# Patient Record
Sex: Female | Born: 1970
Health system: Southern US, Community
[De-identification: ages and names within clinical notes are randomized; demographics above are authoritative.]

## PROBLEM LIST (undated history)

## (undated) ENCOUNTER — Ambulatory Visit: Admission: EM | Payer: 59 | Source: Home / Self Care

## (undated) DIAGNOSIS — E669 Obesity, unspecified: Secondary | ICD-10-CM

## (undated) DIAGNOSIS — E785 Hyperlipidemia, unspecified: Secondary | ICD-10-CM

## (undated) DIAGNOSIS — F329 Major depressive disorder, single episode, unspecified: Secondary | ICD-10-CM

## (undated) DIAGNOSIS — R112 Nausea with vomiting, unspecified: Secondary | ICD-10-CM

## (undated) DIAGNOSIS — F419 Anxiety disorder, unspecified: Secondary | ICD-10-CM

## (undated) DIAGNOSIS — K219 Gastro-esophageal reflux disease without esophagitis: Secondary | ICD-10-CM

## (undated) DIAGNOSIS — F32A Depression, unspecified: Secondary | ICD-10-CM

## (undated) DIAGNOSIS — T8859XA Other complications of anesthesia, initial encounter: Secondary | ICD-10-CM

## (undated) DIAGNOSIS — Z9889 Other specified postprocedural states: Secondary | ICD-10-CM

## (undated) HISTORY — PX: OTHER SURGICAL HISTORY: SHX169

## (undated) HISTORY — PX: ABDOMINAL HYSTERECTOMY: SHX81

## (undated) HISTORY — DX: Gastro-esophageal reflux disease without esophagitis: K21.9

## (undated) HISTORY — PX: LAPAROSCOPIC OOPHERECTOMY: SHX6507

## (undated) HISTORY — PX: CHOLECYSTECTOMY: SHX55

## (undated) HISTORY — PX: APPENDECTOMY: SHX54

## (undated) HISTORY — DX: Obesity, unspecified: E66.9

## (undated) HISTORY — DX: Anxiety disorder, unspecified: F41.9

## (undated) HISTORY — DX: Hyperlipidemia, unspecified: E78.5

## (undated) HISTORY — PX: UPPER GI ENDOSCOPY: SHX6162

## (undated) HISTORY — DX: Depression, unspecified: F32.A

---
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
## 2004-02-19 ENCOUNTER — Ambulatory Visit: Payer: Self-pay

## 2004-05-23 ENCOUNTER — Ambulatory Visit (HOSPITAL_COMMUNITY): Admission: RE | Admit: 2004-05-23 | Discharge: 2004-05-23 | Payer: Self-pay | Admitting: Gynecology

## 2004-05-23 ENCOUNTER — Encounter (INDEPENDENT_AMBULATORY_CARE_PROVIDER_SITE_OTHER): Payer: Self-pay | Admitting: Specialist

## 2004-07-09 ENCOUNTER — Ambulatory Visit: Payer: Self-pay

## 2006-04-12 ENCOUNTER — Ambulatory Visit: Payer: Self-pay | Admitting: Gynecology

## 2006-04-12 ENCOUNTER — Encounter (INDEPENDENT_AMBULATORY_CARE_PROVIDER_SITE_OTHER): Payer: Self-pay | Admitting: Gynecology

## 2006-11-22 ENCOUNTER — Ambulatory Visit: Payer: Self-pay | Admitting: Gynecology

## 2007-01-10 ENCOUNTER — Ambulatory Visit: Payer: Self-pay | Admitting: Gynecology

## 2007-01-10 ENCOUNTER — Encounter (INDEPENDENT_AMBULATORY_CARE_PROVIDER_SITE_OTHER): Payer: Self-pay | Admitting: Gynecology

## 2007-01-10 ENCOUNTER — Ambulatory Visit (HOSPITAL_COMMUNITY): Admission: RE | Admit: 2007-01-10 | Discharge: 2007-01-11 | Payer: Self-pay | Admitting: Gynecology

## 2007-01-25 ENCOUNTER — Ambulatory Visit: Payer: Self-pay | Admitting: Family Medicine

## 2007-02-11 ENCOUNTER — Ambulatory Visit: Payer: Self-pay | Admitting: Gynecology

## 2007-02-14 ENCOUNTER — Ambulatory Visit: Payer: Self-pay | Admitting: Gynecology

## 2007-02-21 ENCOUNTER — Ambulatory Visit: Payer: Self-pay | Admitting: Family Medicine

## 2008-01-18 ENCOUNTER — Ambulatory Visit: Payer: Self-pay | Admitting: Gynecology

## 2009-03-29 ENCOUNTER — Ambulatory Visit: Payer: Self-pay | Admitting: General Practice

## 2009-05-02 ENCOUNTER — Ambulatory Visit: Payer: Self-pay | Admitting: Obstetrics and Gynecology

## 2009-05-03 ENCOUNTER — Encounter: Payer: Self-pay | Admitting: Family Medicine

## 2009-05-03 LAB — CONVERTED CEMR LAB
Clue Cells Wet Prep HPF POC: NONE SEEN
WBC, Wet Prep HPF POC: NONE SEEN

## 2010-09-09 NOTE — H&P (Signed)
[name], [name]              ACCOUNT NO.:  [id number]   MEDICAL RECORD NO.:  [id number]         PATIENT TYPE:  WOIB   LOCATION:                                FACILITY:  [facility]   PHYSICIAN:  Ginger Carne, MD  DATE OF BIRTH:  1970/06/02   DATE OF ADMISSION:  01/10/2007  DATE OF DISCHARGE:                              HISTORY & PHYSICAL   REASON FOR HOSPITALIZATION:  Menorrhagia and severe dysmenorrhea, with  genuine urinary stress incontinence, second-degree uterovaginal prolapse  and third degree cystocele.   HISTORY OF PRESENT ILLNESS:  This patient is a 40 year old gravida 4,  para 3-0-1-3 Caucasian female with a longstanding worsening history over  the past 2-3 years of heavy menses lasting 7-8 days in length and severe  dysmenorrhea.  The patient has utilized nonsteroidal anti-inflammatory  agents, including oral contraception with minimal benefit.  Endometrial  biopsy revealed proliferative endometrium without neoplasia or  hyperplasia.   Transvaginal sonography demonstrated no evidence of the intracavitary  lesions with SIS confirmation.  In addition, the patient states that she  loses urine with coughing, straining and other Valsalva maneuvers.  She  denies postvoid dribbling, nocturia or overactive bladder symptoms.  She  has never had urological surgery and takes no medications to enhance her  propensity for losing urine.  She does not have fecal incontinence.   OB/GYN HISTORY:  The patient has had three full-term vaginal deliveries  and one miscarriage, first trimester, in the past.  She uses condoms for  contraception   ALLERGIES:  None.   CURRENT MEDICATIONS:  Vicodin one to two every 6-8 hours as needed for  dysmenorrhea, Lexapro 10 mg twice a day.   PAST SURGICAL HISTORY:  The patient has had a normal colonoscopy in  2005.   PAST MEDICAL HISTORY:  Depression.   SOCIAL HISTORY:  The patient stopped smoking 2 months ago.  Denies  alcohol or illicit drug  abuse.   REVIEW OF SYSTEMS:  14-point comprehensive review of systems within  normal limits.   FAMILY HISTORY:  Her mother has type 2 diabetes.  Otherwise no first-  degree relatives with breast, ovarian, or uterine carcinoma.   PHYSICAL EXAMINATION:  GENERAL APPEARANCE:  Pleasant female, no acute  distress.  VITAL SIGNS:  Blood pressure is 111/79, pulse 81 and regular, weight 183  pounds, height 5 feet, 8 inches.  HEENT: Grossly normal.  BREASTS:  Breast exam without masses, discharge, thickenings or  tenderness.  CHEST:  Clear to percussion and auscultation.  CARDIOVASCULAR:  Without murmurs or enlargements.  Regular rate and  rhythm.  EXTREMITIES, LYMPHATICS, SKIN AND NEUROLOGICAL:  All within normal  limits.  ABDOMEN:  Soft without gross hepatosplenomegaly.  PELVIC:  Normal Pap smear in 03/2006.  EXTERNAL GENITALIA: Vulva and vagina normal.  Cervix smooth without  erosions or lesions.  The uterus is 6 weeks in size, globular.  Both  adnexa found to be palpable and normal.  The patient has a third degree  cystocele with evidence of second degree vault prolapse.  No evidence of  a rectocele.  RECTOVAGINAL:  Confirmatory.  Residual  urine volume is 18 mL with a  positive cough stress test and Q-tip angle of greater than 60 degrees.   IMPRESSION/PLAN:  Menorrhagia with intractable dysmenorrhea and genuine  urinary stress incontinence.   PLAN:  The patient after discussing options including use of a Mirena  IUD, endometrial femoral balloon ablation and a total vaginal  hysterectomy, opted for the latter.  Ashby Dawes of said procedure discussed  in detail, including possible injuries to ureter, bowel, bladder,  possible conversion to a laparoscopic or open procedure, hemorrhage  requiring blood transfusion, infection and other unforeseen  complications.  Nature of tension-free vaginal tape procedure with  cystoscopy and uterosacral vaginal vault suspension discussed.  Risks   including possible injuries to bladder or ureter, graft erosion,  rejection or infection, and possible recurrent incontinence and or  urinary urgency and or urinary retention.  All questions answered to the  satisfaction of said patient.  The patient verbalized understanding of  same.      Ginger Carne, MD  Electronically Signed     SHB/MEDQ  D:  01/08/2007  T:  01/09/2007  Job:  [id number]

## 2010-09-09 NOTE — Assessment & Plan Note (Signed)
NAME:  [name], [name]              ACCOUNT NO.:  [id number]   MEDICAL RECORD NO.:  [id number]          PATIENT TYPE:  POB   LOCATION:  [facility] at [facility]         FACILITY:  [facility]   PHYSICIAN:  Johnella Moloney, MD        DATE OF BIRTH:  Mar 12, 1971   DATE OF SERVICE:  01/25/2007                                  CLINIC NOTE   CHIEF COMPLAINT:  Vaginal irritation, status post surgery.   HISTORY OF PRESENT ILLNESS:  The patient is a 40 year old who underwent  a total vaginal hysterectomy and TVT procedure, anterior colporrhaphy  and McCall colposuspension performed by Ginger Carne, MD, on  January 10, 2007.  The patient had an uncomplicated postoperative  course and was discharged to home on the following morning.  She comes  to the clinic today reporting some vaginal irritation that is noted when  she is walking.  It feels like a misplaced tampon.  Otherwise the  patient reports some brownish discharge which has been routine since the  operation and denies any other further symptoms.  She is also requesting  a refill of her Vicodin prescription for pain.  She does report having  increased pain since she started working.  She denies any fevers,  chills, sweats or any other symptoms.   PHYSICAL EXAMINATION:  Blood pressure is 107/74, height is 5 feet 8  inches.  GENERAL:  No apparent distress.  PELVIC:  The patient is noted to have normal external female genitalia.  On speculum exam, she has a well-healing vaginal cuff, some brownish  discharge noted in the vaginal vault which is consistent with routine  postoperative drainage.  On the anterior colporrhaphy site, the patient  was noted to have a suture that was present and about 1.5 cm of suture  length was noted past a knot after the anterior colporrhaphy repair.  This extra suture material was trimmed down close to the level of the  knot, leaving about 2 mm of suture material past the knot.  Otherwise,  no mesh material was  identified and anterior colporrhaphy incision was  clean, dry, and intact.   IMPRESSION:  Vaginal irritation from suture material.   PLAN:  The patient is now status post trimming of excess suture material  that was noted on examination.  A normal postoperative examination  otherwise.  She was told to come back if she has any further worsening  symptoms or any other concerns.  She was given a prescription for  Vicodin 500/5 mg one to two tablets every 6 hours as needed for pain  with 30 tablets and no refills.  The patient was told to follow up in 3  weeks for further evaluation.           ______________________________  Johnella Moloney, MD     UD/MEDQ  D:  01/25/2007  T:  01/26/2007  Job:  [id number]

## 2010-09-09 NOTE — Assessment & Plan Note (Signed)
NAME:  [name], MEHTA              ACCOUNT NO.:  [id number]   MEDICAL RECORD NO.:  [id number]          PATIENT TYPE:  POB   LOCATION:  [facility] at [facility]         FACILITY:  [facility]   PHYSICIAN:  Tinnie Gens, MD        DATE OF BIRTH:  29-Jul-1970   DATE OF SERVICE:  02/21/2007                                  CLINIC NOTE   CHIEF COMPLAINT:  Boil on leg.   HISTORY OF PRESENT ILLNESS:  The patient is a 40 year old who underwent  hysterectomy some time ago was seen, was seen last week in my office.  Since that time has developed boil on the right inner thigh.  She had  shaved this area.   Discharge on hot soaks.  She does not believe it has come to a head.  She denies fevers, chills.  No erythema in the area.   ON EXAM:  Vitals as on the chart.  GENERAL:  Well-developed, well-nourished female in no acute distress.  Upper thigh shows a 1 x 0.5 cm indurated area with no significant  erythema and no fluctuance.   IMPRESSION:  Boil right side.   PLAN:  Treat for presumed MRSA with doxicycline 100 mg p.o. b.i.d. x10  days.           ______________________________  Tinnie Gens, MD     TP/MEDQ  D:  02/21/2007  T:  02/22/2007  Job:  [id number]

## 2010-09-09 NOTE — Op Note (Signed)
NAMEYAEKO, FAZEKAS              ACCOUNT NO.:  [id number]   MEDICAL RECORD NO.:  [id number]          PATIENT TYPE:  OIB   LOCATION:  9312                          FACILITY:  [facility]   PHYSICIAN:  Ginger Carne, MD  DATE OF BIRTH:  03-06-1971   DATE OF PROCEDURE:  01/10/2007  DATE OF DISCHARGE:                               OPERATIVE REPORT   PREOPERATIVE DIAGNOSES:  1. Partial uterovaginal prolapse.  2. Genuine urinary stress incontinence.  3. Menorrhagia.   POSTOPERATIVE DIAGNOSES:  1. Partial uterovaginal prolapse.  2. Genuine urinary stress incontinence.  3. Menorrhagia.  4. Cystocele.   POSTOPERATIVE DIAGNOSES:  1. Partial uterovaginal prolapse.  2. Genuine urinary stress incontinence.  3. Menorrhagia.  4. Cystocele.   PROCEDURES:  1. Tension-free vaginal tape procedure with cystoscopy.  2. Total vaginal hysterectomy with preservation of both tubes and      ovaries with Restaurant manager, [employer].  3. Anterior colporrhaphy.   SURGEON:  Ginger Carne, MD.   ASSISTANTMichele Mcalpine D. Rose, M.D.   ESTIMATED BLOOD LOSS:  75 mL.   COMPLICATIONS:  None immediate.   DISPOSITION OF SPECIMEN:  To pathology.   SPECIMEN:  Uterus, cervix.   OPERATIVE FINDINGS:  External genitalia, vulva and vagina were normal,  second-degree uterovaginal prolapse noted.  The uterus and cervix  appeared normal.  Both tubes and ovaries appeared normal.   OPERATIVE PROCEDURE:  The patient prepped and draped in the usual  fashion and placed in the lithotomy position.  Betadine solution used  for antiseptic and the patient was catheterized prior to procedure.  After adequate general anesthesia, a double tooth tenaculum placed on  the anterior and posterior lips of the cervix.  Marcaine with  epinephrine was injected circumferentially around the cervix.  Afterwards, 2 cm of anterior and posterior vaginal epithelium were  incised transversely.  Peritoneal reflections identified and opened  without injury  to their respective organs.  Uterosacral cardinal  ligament complexes clamped and ligated with 0 Vicryl suture.  This  extended to the uterine vasculature in the standard [place] fashion.  The vessels were clamped cut and ligated with 0 Vicryl suture.  This was  advanced to the broad ligaments at the utero-ovarian ligament complex,  transfixation with 0 Vicryl sutures was used on either side and the  ligaments severed from the uterine corpus.  Bleeding points  hemostatically checked.  Blood clots removed.  A McCall colposuspension  was performed with 0 Vicryl suture and the remainder of the cuff closed  with 0 Vicryl running interlocking suture.   Tension-free vaginal tape procedure was performed.  The anterior vaginal  epithelium was incised in the midline.  The pubovesical cervical fascia  was dissected off the vaginal epithelium.  Space of Retzius identified.  The pubovesical cervical fascia was fractured in the midline and  reapproximated with 2-0 Prolene interrupted sutures.  Using a bottom up  [employer] Prolene TVT system, trocars were placed from below  through the space of Retzius hugging the posterior aspect of the pubic  bone and emanating 1-2 cm lateral to the symphysis pubis.  Immediate  cystoscopy followed.  No injury was noted to the bladder including the  urethra, trigone area, lateral walls and dome and posterior surface of  the bladder.  At this point, the fluid was removed.  Tensioning of the  tape to 250 mL of saline in the bladder was accomplished.  The plastic  sheaths were removed from the tape and bladder was emptied.  The skin  sites were closed with Dermabond and 2-0 Monocryl running interlocking  suture used for closure of the vaginal epithelium.  The patient  tolerated the procedure well and returned to the post anesthesia  recovery room in excellent condition.      Ginger Carne, MD  Electronically Signed     SHB/MEDQ  D:  01/10/2007  T:   01/10/2007  Job:  [id number]

## 2010-09-09 NOTE — Assessment & Plan Note (Signed)
NAME:  Laura, Grant              ACCOUNT NO.:  [id number]   MEDICAL RECORD NO.:  [id number]          PATIENT TYPE:  POB   LOCATION:  [facility] at [facility]         FACILITY:  [facility]   PHYSICIAN:  Ginger Carne, MD DATE OF BIRTH:  1971-01-28   DATE OF SERVICE:  01/18/2008                                  CLINIC NOTE   Ms. [name] is here today for routine gynecologic evaluation.  She had  undergone, in September 2008, a total vaginal hysterectomy, tension-free  vaginal tape procedure with cystoscopy, anterior colporrhaphy and McCall  colposuspension.  The patient states she still has symptoms of type 1  urinary stress incontinence.  However, she has no other specific  gynecological complaints.  In addition, she has no GI, GU or cardiac  symptomatology.  She uses Xanax for anxiety control and the patient is  not abusing said medication.  She uses 1 tablet 0.5 mg up to 3 times a  day and I feel comfortable prescribing this for her, it helps her  control and deal with her anxiety in terms of coping through the day.  The remainder of her history is noted in the chart.   SALIENT PHYSICAL FINDINGS:  VITAL SIGNS:  Blood pressure one weight over  71, weight 209 pounds, height 5 feet 8 inches, pulse 81 and regular.  HEENT:  Grossly normal.  BREASTS:  Without masses, discharge, thickenings, or tenderness.  CHEST:  Clear to percussion and auscultation.  CARDIOVASCULAR:  Without murmurs or enlargements.  Regular rate and  rhythm.  EXTREMITIES, LYMPHATICS, SKIN, NEUROLOGICAL, MUSCULOSKELETAL SYSTEMS:  Within normal limits.  ABDOMEN:  Soft without gross hepatosplenomegaly.  PELVIC:  External genitalia, vulva, and vagina reveal evidence of a well-  supported vaginal cuff that is healed.  Anteriorly, there is a  approximately 1 cm tender nodule located at the urethral vesical  junction.  Both adnexa palpable found to be normal.   IMPRESSION AND PLAN:  Overall her examination appears to be  normal.  I  think there is a possibility that one side of her sling may have slept  through the musculature and has created a small bulge that is end after  her intercourse.  The patient uses lubrication.  She is emptied her  bladder more frequently to avoid of loss of urine and volume control  that did seem to work well.  At this point, she is not ready to have a  redo and I explained to the patient that trying to remove said nodule  may result in urethrovaginal fistula as we will sit tight for now and  she is happy with this approach.  I have prescribed Xanax 0.5 mg 1 up to  3 times a day #90 for anxiety 3 refills.  There is no new family history  or old history of breast cancer.           ______________________________  Ginger Carne, MD     SHB/MEDQ  D:  01/18/2008  T:  01/18/2008  Job:  [id number]

## 2010-09-09 NOTE — Discharge Summary (Signed)
[name], [name]              ACCOUNT NO.:  [id number]   MEDICAL RECORD NO.:  [id number]          PATIENT TYPE:  OIB   LOCATION:  9312                          FACILITY:  [facility]   PHYSICIAN:  Ginger Carne, MD  DATE OF BIRTH:  1971-03-17   DATE OF ADMISSION:  01/10/2007  DATE OF DISCHARGE:                               DISCHARGE SUMMARY   REASON FOR HOSPITALIZATION:  Menorrhagia, severe dysmenorrhea, genuine  urinary stress incontinence, third-degree cystocele with second-degree  uterovaginal prolapse.   IN-HOSPITAL PROCEDURES:  Total vaginal hysterectomy, TVT procedure with  cystoscopy, anterior colporrhaphy, McCall colposuspension.   FINAL DIAGNOSES:  Total vaginal hysterectomy, TVT procedure with  cystoscopy, anterior colporrhaphy, McCall colposuspension.   HOSPITAL COURSE:  This is a 40 year old multiparous Caucasian female who  underwent the aforementioned procedure on the 15th of September 2008.  Intraoperative course was uneventful.  Postoperatively she was afebrile,  her vital signs were stable.  Postoperative hemoglobin 10.6 from a pre-  op of 13.5.  Creatinine 0.56.  She had scant vaginal flow.  Abdomen was  soft.  Incision dry.  Lungs clear, and calves without tenderness.  She  voided 400 mL following removal of her Foley catheter in the morning.   She was discharged with routine postoperative instructions including  timed voids whereby every four hours the patient will void for the next  three weeks, contacting the office for temperature elevation above 100.4  degrees Fahrenheit, increasing abdominal or incisional pain, increased  vaginal bleeding, GI or GU complains.  She will also contact the office  for urinary retention or increase in frequency of voiding.  She was  discharged with Percocet 5/325 1-2 every 4-6 hours and Keflex 500 mg  twice a day for five days.  She will also go home on Lexapro, Xanax as  well.  The patient will contact the office to be seen  in four weeks at  the [facility] facility.  All questions answered to the satisfaction of  said patient, and patient verbalized understanding of same.      Ginger Carne, MD  Electronically Signed     SHB/MEDQ  D:  01/11/2007  T:  01/11/2007  Job:  [id number]

## 2010-09-12 NOTE — Op Note (Signed)
[name], [name]              ACCOUNT NO.:  [id number]   MEDICAL RECORD NO.:  [id number]          PATIENT TYPE:  AMB   LOCATION:  [facility]                           FACILITY:  [facility]   PHYSICIAN:  Ginger Carne, MD  DATE OF BIRTH:  October 30, 1970   DATE OF PROCEDURE:  05/23/2004  DATE OF DISCHARGE:                                 OPERATIVE REPORT   PREOPERATIVE DIAGNOSES:  1.  Right ovarian multicystic mass.  2.  Chronic right lower quadrant pain.  3.  Chronic appendicitis.   POSTOPERATIVE DIAGNOSES:  1.  Right ovarian multicystic mass.  2.  Chronic right lower quadrant pain.  3.  Chronic appendicitis.   PROCEDURES:  1.  Laparoscopic right oophorectomy.  2.  Laparoscopic appendectomy.   SURGEON:  Ginger Carne, MD   ASSISTANT:  None.   COMPLICATIONS:  None immediate.   ESTIMATED BLOOD LOSS:  Minimal.   SPECIMENS:  Right ovary and appendix.   ANESTHESIA:  General.   OPERATIVE FINDINGS:  External genitalia, vulva and vagina normal.  Cervix  smooth without erosions or lesions.  The patient had previously excised  salpinx bilaterally preceding in vitro fertilization in the past.  This was  performed at [facility].  The right ovary was  multicystic, about 4 cm in size, with adhesive disease surrounding same.  The patient's diagnosis prior to removal of both tubes was chronic pelvic  inflammatory disease.  The upper abdomen revealed no evidence for Fitz-Hugh-  Curtis syndrome.  Gallbladder sonogram performed prior to surgery revealed  no evidence of gallstones.  The appendix was significantly adherent to its  respective sidewall, thickened, as well as the mesoappendix, and slightly  erythematous, consistent with chronic appendicitis.  Large and small bowel  were grossly normal.  The ureter was carefully identified on the right  pelvic region.  No evidence of femoral, inguinal or obturator hernias.   OPERATIVE PROCEDURE:  Patient prepped and draped in  the usual fashion and  placed in lithotomy position.  Betadine solution used for antiseptic.  Patient catheterized prior to the procedure.  After adequate general  anesthesia, a tenaculum placed on the anterior lip of the cervix and a  ___________ uterine manipulator placed in the endocervical canal.   Vertical infraumbilical incision was made and the Veress needle placed in  the abdomen, and opening and closing pressures were 10-15 mmHg.  The  _________ trocar placed in the same incision, the laparoscope placed in the  trocar sleeve.  Two 5 mm ports were made in the left lower quadrant and left  hypogastric regions, respectively.  Inspection of the pelvic contents was  then followed by identification of the right ureter in its pelvic course.  The peritoneum above said ureter was opened to assure that the course of the  ureter did not interfere with the right infundibulopelvic ligament.  The  right infundibulopelvic ligament was then bipolar cauterized and cut after  skeletonization.  The specimen was removed in the Endopouch bag.   Afterwards, the appendix was visualized and the mesoappendix bipolar  cauterized to the base.  Two  0 Vicryl loops placed at the base and one about  7 mm above the first two.  The appendix cut above the first two ties and  removed through an Endopouch bag.  No active bleeding noted on either site.  Copious irrigation with lactated Ringer's at the appendiceal base and cul-de-  sac was followed by removal of irrigant.  After removal of gas, closure of  10 mm fascial site with 0 Vicryl suture and 4-0 Vicryl for subcuticular  closure.  Instrument and sponge count were correct.  The patient tolerated  the procedure well and returned to the postanesthesia recovery room in  excellent condition.      SHB/MEDQ  D:  05/23/2004  T:  05/23/2004  Job:  [id number]

## 2010-09-12 NOTE — H&P (Signed)
NAMETASMINE, HIPWELL              ACCOUNT NO.:  [id number]   MEDICAL RECORD NO.:  [id number]          PATIENT TYPE:  AMB   LOCATION:  SDC                           FACILITY:  [facility]   PHYSICIAN:  Ginger Carne, MD  DATE OF BIRTH:  03-21-1971   DATE OF ADMISSION:  DATE OF DISCHARGE:                                HISTORY & PHYSICAL   ADMITTING DIAGNOSIS:  Persistent right ovarian mass and right lower quadrant  pain.   IN-HOSPITAL PROCEDURE:  Laparoscopic right oophorectomy and appendectomy.   HISTORY OF PRESENT ILLNESS:  This patient is a 40 year old gravida 3 para 3-  0-0-3 Caucasian female admitted because of a 84-month history of an enlarging  right adnexal mass.  The patient was seen in July 2005 with complaints of  right-sided discomfort.  At that time an ultrasound demonstrated about a 4  cm cystic mass of the right ovary.  It was multicystic with smooth internal  echoes and no suggestion for carcinoma.  The patient was advised to return  for follow-up.  Her pain has continued and worsened with this said mass  about 4.5 to 5 cm.  There are no genitourinary, gastrointestinal, or  musculoskeletal sources for her pain.  Urinalysis is normal.   OBSTETRICAL AND GYNECOLOGICAL HISTORY:  The patient has had three full-term  vaginal deliveries in November 2003 and in 1991 and 1992.  In 2000 the  patient was diagnosed via laparoscopy with a bilateral tubal occlusion and  bilateral fimbrioplasty was performed.  She was unable to conceive and  through [facility] underwent an in vitro procedure which  was successful in 2002.  Prior to that event, the patient had both right and  left salpinx removed at [facility] to facility in vitro success.   ALLERGIES:  None.   CURRENT MEDICATIONS:  None.   SOCIAL HISTORY:  The patient smokes less than half a pack of cigarettes a  day.  Does not abuse alcohol or illicit drug abuse.   REVIEW OF SYSTEMS:  Negative.   SURGICAL  HISTORY:  Per above.   PHYSICAL EXAMINATION:  VITAL SIGNS:  The patient's height is 5 feet 8  inches, weight 205 pounds, blood pressure 120/78.  HEENT:  Grossly normal.  BREAST:  Without mass or discharge, thickenings, or tenderness.  CHEST:  Clear to percussion and auscultation.  CARDIOVASCULAR:  Without murmurs or enlargements, regular rate and rhythm.  EXTREMITY, LYMPHATIC, SKIN, NEUROLOGIC, MUSCULOSKELETAL:  Normal.  ABDOMEN:  Soft without gross hepatosplenomegaly.  PELVIC:  External genitalia, vulva, and vagina normal.  Cervix smooth  without erosions or lesions.  Uterus is normal in size.  Right adnexa is  tender with slight fullness.  Left adnexa is normal.  Rectal exam is  hemoccult negative.   IMPRESSION:  Persistent right adnexal mass for 6 months.   PLAN:  After a thorough discussion with the patient understanding that it is  unlikely this mass is a carcinoma, it is still prudent to proceed with said  operative laparoscopy.  The nature of said procedure discussed in detail.  The patient understands that if the  cysts are unable to be satisfactorily  removed, right oophorectomy will be performed and if the appendix is easy to  remove, it will be done at the same time.      SHB/MEDQ  D:  05/19/2004  T:  05/19/2004  Job:  [id number]

## 2011-02-05 LAB — CBC
HCT: 31.1 — ABNORMAL LOW
MCV: 87
RBC: 3.57 — ABNORMAL LOW
WBC: 14.5 — ABNORMAL HIGH

## 2011-02-05 LAB — BASIC METABOLIC PANEL
BUN: 3 — ABNORMAL LOW
Chloride: 103
GFR calc non Af Amer: >60
Glucose, Bld: 150 — ABNORMAL HIGH
Potassium: 4.3

## 2011-02-05 LAB — TYPE AND SCREEN: ABO/RH(D): O POS

## 2011-02-06 LAB — BASIC METABOLIC PANEL
BUN: 8
CO2: 25
Chloride: 104
Creatinine, Ser: 0.56
Glucose, Bld: 99

## 2011-02-06 LAB — CBC
MCHC: 34.4
MCV: 85.9
Platelets: 214
RDW: 13.4

## 2011-02-06 LAB — HCG, SERUM, QUALITATIVE: Preg, Serum: NEGATIVE

## 2011-02-25 ENCOUNTER — Ambulatory Visit: Payer: Self-pay | Admitting: General Practice

## 2011-12-30 ENCOUNTER — Ambulatory Visit: Payer: Self-pay | Admitting: General Practice

## 2015-05-29 ENCOUNTER — Other Ambulatory Visit: Payer: Self-pay | Admitting: Family Medicine

## 2015-05-29 DIAGNOSIS — R1011 Right upper quadrant pain: Secondary | ICD-10-CM

## 2015-06-04 ENCOUNTER — Ambulatory Visit
Admission: RE | Admit: 2015-06-04 | Discharge: 2015-06-04 | Disposition: A | Discharge status: Home / Self Care | Payer: BLUE CROSS/BLUE SHIELD | Source: Ambulatory Visit | Attending: Family Medicine | Admitting: Family Medicine

## 2015-06-04 DIAGNOSIS — K824 Cholesterolosis of gallbladder: Secondary | ICD-10-CM | POA: Insufficient documentation

## 2015-06-04 DIAGNOSIS — R1011 Right upper quadrant pain: Secondary | ICD-10-CM | POA: Diagnosis not present

## 2015-06-18 ENCOUNTER — Other Ambulatory Visit: Payer: Self-pay | Admitting: Gastroenterology

## 2015-06-18 DIAGNOSIS — R194 Change in bowel habit: Secondary | ICD-10-CM

## 2015-06-18 DIAGNOSIS — R1011 Right upper quadrant pain: Secondary | ICD-10-CM

## 2015-06-27 ENCOUNTER — Ambulatory Visit: Admission: RE | Admit: 2015-06-27 | Payer: BLUE CROSS/BLUE SHIELD | Source: Ambulatory Visit

## 2015-08-15 ENCOUNTER — Encounter: Payer: Self-pay | Admitting: Emergency Medicine

## 2015-08-15 ENCOUNTER — Emergency Department
Admission: EM | Admit: 2015-08-15 | Discharge: 2015-08-15 | Disposition: A | Discharge status: Home / Self Care | Payer: BLUE CROSS/BLUE SHIELD | Attending: Emergency Medicine | Admitting: Emergency Medicine

## 2015-08-15 ENCOUNTER — Emergency Department: Payer: BLUE CROSS/BLUE SHIELD

## 2015-08-15 DIAGNOSIS — R1011 Right upper quadrant pain: Secondary | ICD-10-CM | POA: Diagnosis present

## 2015-08-15 DIAGNOSIS — Z90710 Acquired absence of both cervix and uterus: Secondary | ICD-10-CM | POA: Diagnosis not present

## 2015-08-15 DIAGNOSIS — F17210 Nicotine dependence, cigarettes, uncomplicated: Secondary | ICD-10-CM | POA: Diagnosis not present

## 2015-08-15 DIAGNOSIS — R1010 Upper abdominal pain, unspecified: Secondary | ICD-10-CM

## 2015-08-15 LAB — URINALYSIS COMPLETE WITH MICROSCOPIC (ARMC ONLY)
BILIRUBIN URINE: NEGATIVE
Glucose, UA: NEGATIVE mg/dL
HGB URINE DIPSTICK: NEGATIVE
KETONES UR: NEGATIVE mg/dL
LEUKOCYTES UA: NEGATIVE
Nitrite: NEGATIVE
PH: 7 (ref 5.0–8.0)
PROTEIN: NEGATIVE mg/dL
SPECIFIC GRAVITY, URINE: 1.014 (ref 1.005–1.030)

## 2015-08-15 LAB — CBC
HCT: 44.4 % (ref 35.0–47.0)
HEMOGLOBIN: 15 g/dL (ref 12.0–16.0)
MCH: 28.8 pg (ref 26.0–34.0)
MCHC: 33.8 g/dL (ref 32.0–36.0)
MCV: 85.3 fL (ref 80.0–100.0)
Platelets: 254 10*3/uL (ref 150–440)
RBC: 5.21 MIL/uL — AB (ref 3.80–5.20)
RDW: 13.5 % (ref 11.5–14.5)
WBC: 10.1 10*3/uL (ref 3.6–11.0)

## 2015-08-15 LAB — COMPREHENSIVE METABOLIC PANEL
ALK PHOS: 83 U/L (ref 38–126)
ALT: 16 U/L (ref 14–54)
ANION GAP: 8 (ref 5–15)
AST: 27 U/L (ref 15–41)
Albumin: 4.9 g/dL (ref 3.5–5.0)
BUN: 10 mg/dL (ref 6–20)
CALCIUM: 9.8 mg/dL (ref 8.9–10.3)
CHLORIDE: 103 mmol/L (ref 101–111)
CO2: 28 mmol/L (ref 22–32)
Creatinine, Ser: 0.52 mg/dL (ref 0.44–1.00)
Glucose, Bld: 90 mg/dL (ref 65–99)
Potassium: 4.6 mmol/L (ref 3.5–5.1)
SODIUM: 139 mmol/L (ref 135–145)
Total Bilirubin: 0.5 mg/dL (ref 0.3–1.2)
Total Protein: 8.1 g/dL (ref 6.5–8.1)

## 2015-08-15 LAB — LIPASE, BLOOD: LIPASE: 23 U/L (ref 11–51)

## 2015-08-15 MED ORDER — IOPAMIDOL (ISOVUE-300) INJECTION 61%
100.0000 mL | Freq: Once | INTRAVENOUS | Status: AC | PRN
  Administered 2015-08-15: 100 mL via INTRAVENOUS

## 2015-08-15 MED ORDER — DICYCLOMINE HCL 20 MG PO TABS: 20.0000 mg | ORAL_TABLET | Freq: Three times a day (TID) | ORAL | Status: DC | PRN

## 2015-08-15 MED ORDER — DIATRIZOATE MEGLUMINE & SODIUM 66-10 % PO SOLN
15.0000 mL | Freq: Once | ORAL | Status: AC
  Administered 2015-08-15: 15 mL via ORAL
  Filled 2015-08-15: qty 30

## 2015-08-15 MED ORDER — MORPHINE SULFATE (PF) 4 MG/ML IV SOLN
4.0000 mg | Freq: Once | INTRAVENOUS | Status: AC
  Administered 2015-08-15: 4 mg via INTRAVENOUS
  Filled 2015-08-15: qty 1

## 2015-08-15 MED ORDER — ONDANSETRON HCL 4 MG/2ML IJ SOLN
4.0000 mg | Freq: Once | INTRAMUSCULAR | Status: AC
  Administered 2015-08-15: 4 mg via INTRAVENOUS
  Filled 2015-08-15: qty 2

## 2015-08-15 MED ORDER — ONDANSETRON HCL 4 MG PO TABS: 4.0000 mg | ORAL_TABLET | Freq: Every day | ORAL | Status: DC | PRN

## 2015-08-15 MED ORDER — SODIUM CHLORIDE 0.9 % IV BOLUS (SEPSIS)
1000.0000 mL | Freq: Once | INTRAVENOUS | Status: AC
  Administered 2015-08-15: 1000 mL via INTRAVENOUS

## 2015-08-15 NOTE — ED Provider Notes (Signed)
Patient signed out from Dr. Archie Balboa with acute on chronic abdominal pain. Plan is to follow up with CAT scan of the abdomen and pelvis. Physical Exam  BP 120/71 mmHg  Pulse 92  Temp(Src) 98.2 F (36.8 C) (Oral)  Resp 17  Ht 5\' 8"  (1.727 m)  Wt 235 lb (106.595 kg)  BMI 35.74 kg/m2  SpO2 97% ----------------------------------------- 6:07 PM on 08/15/2015 -----------------------------------------   Physical Exam Patient has not any acute distress. ED Course  Procedures IMPRESSION: 1. No acute abdominal/pelvic findings, mass lesions or adenopathy. 2. Mild diffuse fatty infiltration of the liver. 3. Status post hysterectomy and right oophorectomy. Left ovary appears normal.   Electronically Signed By: Marijo Sanes M.D. On: 08/15/2015 16:10       MDM CAT scan of the abdomen and pelvis without any acute findings. There is a mild diffuse fatty physician in the liver. I discussed the findings with the patient as well as her companion. She has a primary care at the [facility]. She says that at this time she is only on a acid reducer. I will add Bentyl as well as Zofran for her nausea and abdominal cramping. I will also be giving her the phone number for GI. She understands the plan for discharge and follow-up and is willing to comply. She is able to tolerate the by mouth contrast for the CAT scan is requesting something to drink in addition.      Orbie Pyo, MD 08/15/15 [phone]

## 2015-08-15 NOTE — ED Notes (Signed)
Pt informed this nurse she is leaving.  This nurse inquire what room pt is from, pt points to room 5.  Primary nurse notified as pt continues to walk out of ED

## 2015-08-15 NOTE — ED Notes (Signed)
Pt with R upper abdominal pain (under R breast that "sometimes" radiates to R axilla/R back).  Pt states that this pain has been a "constant, jabbing pain since January of this year".  She has been under chiropractic treatment since then and has been diagnosed with gallbladder polyps as well.  She started having diarrhea and nausea yesterday, and pain became intense suddenly this morning.

## 2015-08-15 NOTE — ED Provider Notes (Signed)
[facility] Emergency Department Provider Note   ____________________________________________  Time seen: ~1430  I have reviewed the triage vital signs and the nursing notes.   HISTORY  Chief Complaint Abdominal Pain   History limited by: Not Limited   HPI Reality A Sheasley is a 45 y.o. female who presents to the emergency department today because of concerns for abdominal pain. Is located in the right upper quadrant. She states she has been having this pain on and off for the past 4 months. Today however became severe. It started this morning. She describes it as a twisting sensation. The patient does have associated nausea. The patient states that she has had an ultrasound on her gallbladder which shows polyps. Denies any recent fevers.   History reviewed. No pertinent past medical history.  There are no active problems to display for this patient.   Past Surgical History  Procedure Laterality Date  . Abdominal hysterectomy      No current outpatient prescriptions on file.  Allergies Review of patient's allergies indicates no known allergies.  No family history on file.  Social History Social History  Substance Use Topics  . Smoking status: Current Every Day Smoker -- 1.00 packs/day    Types: Cigarettes  . Smokeless tobacco: None  . Alcohol Use: Yes    Review of Systems  Constitutional: Negative for fever. Cardiovascular: Negative for chest pain. Respiratory: Negative for shortness of breath. Gastrointestinal: Positive for epigastric pain Neurological: Negative for headaches, focal weakness or numbness.  10-point ROS otherwise negative.  ____________________________________________   PHYSICAL EXAM:  VITAL SIGNS: ED Triage Vitals  Enc Vitals Group     BP 08/15/15 1307 119/79 mmHg     Pulse Rate 08/15/15 1307 105     Resp 08/15/15 1307 18     Temp 08/15/15 1307 98.2 F (36.8 C)     Temp Source 08/15/15 1307 Oral     SpO2  08/15/15 1307 98 %     Weight 08/15/15 1307 235 lb (106.595 kg)     Height 08/15/15 1307 5\' 8"  (1.727 m)     Head Cir --      Peak Flow --      Pain Score 08/15/15 1308 7   Constitutional: Alert and oriented. Well appearing and in no distress. Eyes: Conjunctivae are normal. PERRL. Normal extraocular movements. ENT   Head: Normocephalic and atraumatic.   Nose: No congestion/rhinnorhea.   Mouth/Throat: Mucous membranes are moist.   Neck: No stridor. Hematological/Lymphatic/Immunilogical: No cervical lymphadenopathy. Cardiovascular: Normal rate, regular rhythm.  No murmurs, rubs, or gallops. Respiratory: Normal respiratory effort without tachypnea nor retractions. Breath sounds are clear and equal bilaterally. No wheezes/rales/rhonchi. Gastrointestinal: Soft and Minimally tender to palpation in the epigastric and right upper quadrant. Genitourinary: Deferred Musculoskeletal: Normal range of motion in all extremities. No joint effusions.   Neurologic:  Normal speech and language. No gross focal neurologic deficits are appreciated.  Skin:  Skin is warm, dry and intact. No rash noted. Psychiatric: Mood and affect are normal. Speech and behavior are normal. Patient exhibits appropriate insight and judgment.  ____________________________________________    LABS (pertinent positives/negatives)  Labs Reviewed  CBC - Abnormal; Notable for the following:    RBC 5.21 (*)    All other components within normal limits  URINALYSIS COMPLETEWITH MICROSCOPIC ([facility] ONLY) - Abnormal; Notable for the following:    Color, Urine YELLOW (*)    APPearance HAZY (*)    Bacteria, UA MANY (*)    Squamous  Epithelial / LPF 6-30 (*)    All other components within normal limits  LIPASE, BLOOD  COMPREHENSIVE METABOLIC PANEL     ____________________________________________   EKG  None  ____________________________________________    RADIOLOGY  CT abd/pel  pending  ____________________________________________   PROCEDURES  Procedure(s) performed: None  Critical Care performed: No  ____________________________________________   INITIAL IMPRESSION / ASSESSMENT AND PLAN / ED COURSE  Pertinent labs & imaging results that were available during my care of the patient were reviewed by me and considered in my medical decision making (see chart for details).  Patient presented to the emergency department today because of concerns for epigastric pain. Had right upper quadrant ultrasound which showed gallbladder polyps. Given the pain is much worse today will obtain a CT scan to further evaluate potential sources of abdominal pain.  ____________________________________________   FINAL CLINICAL IMPRESSION(S) / ED DIAGNOSES  Abdominal pain  Nance Pear, MD 08/15/15 1446

## 2015-08-15 NOTE — ED Notes (Signed)
Pt's discharge papers and prescriptions put in envelope and left at charge RN desk.

## 2015-08-15 NOTE — ED Notes (Signed)
Pt presents to ED with RUQ abdominal pain. Pt reports has been told she has polyps in her gallbladder. Pt states she has been seen multiple times. Pt states today the pain has increased. Pt states feels like its twisting. Pt reports diarrhea.

## 2015-08-16 MED ORDER — DICYCLOMINE HCL 20 MG PO TABS: 20.0000 mg | ORAL_TABLET | Freq: Three times a day (TID) | ORAL | Status: DC | PRN

## 2015-08-16 MED ORDER — ONDANSETRON HCL 4 MG PO TABS: 4.0000 mg | ORAL_TABLET | Freq: Every day | ORAL | Status: DC | PRN

## 2015-08-16 NOTE — ED Provider Notes (Signed)
Patient called the emergency department and will charge nurse reviewed patient did not receive these prescriptions, and so I printed them out and they will be left at the entrance desk for patient to pick up. Bentyl and Zofran.  Lisa Roca, MD 08/16/15 [phone]

## 2015-08-30 DIAGNOSIS — K635 Polyp of colon: Secondary | ICD-10-CM | POA: Insufficient documentation

## 2015-09-11 DIAGNOSIS — Z9049 Acquired absence of other specified parts of digestive tract: Secondary | ICD-10-CM | POA: Insufficient documentation

## 2016-09-22 ENCOUNTER — Other Ambulatory Visit: Payer: Self-pay | Admitting: Family Medicine

## 2016-09-22 DIAGNOSIS — Z1231 Encounter for screening mammogram for malignant neoplasm of breast: Secondary | ICD-10-CM

## 2016-10-07 ENCOUNTER — Ambulatory Visit
Admission: RE | Admit: 2016-10-07 | Discharge: 2016-10-07 | Disposition: A | Discharge status: Home / Self Care | Payer: BLUE CROSS/BLUE SHIELD | Source: Ambulatory Visit | Attending: Family Medicine | Admitting: Family Medicine

## 2016-10-07 DIAGNOSIS — Z1231 Encounter for screening mammogram for malignant neoplasm of breast: Secondary | ICD-10-CM | POA: Insufficient documentation

## 2016-10-13 ENCOUNTER — Other Ambulatory Visit: Payer: Self-pay | Admitting: *Deleted

## 2016-10-13 ENCOUNTER — Inpatient Hospital Stay
Admission: RE | Admit: 2016-10-13 | Discharge: 2016-10-13 | Disposition: A | Discharge status: Home / Self Care | Payer: Self-pay | Source: Ambulatory Visit | Attending: *Deleted | Admitting: *Deleted

## 2016-10-13 DIAGNOSIS — Z9289 Personal history of other medical treatment: Secondary | ICD-10-CM

## 2017-02-15 DIAGNOSIS — R1310 Dysphagia, unspecified: Secondary | ICD-10-CM | POA: Insufficient documentation

## 2017-02-15 DIAGNOSIS — K219 Gastro-esophageal reflux disease without esophagitis: Secondary | ICD-10-CM | POA: Insufficient documentation

## 2017-07-13 ENCOUNTER — Other Ambulatory Visit: Payer: Self-pay | Admitting: Family Medicine

## 2017-07-19 ENCOUNTER — Other Ambulatory Visit: Payer: Self-pay | Admitting: Family Medicine

## 2017-07-19 DIAGNOSIS — Z1231 Encounter for screening mammogram for malignant neoplasm of breast: Secondary | ICD-10-CM

## 2018-04-19 ENCOUNTER — Encounter: Payer: Self-pay | Admitting: Dietician

## 2018-04-19 ENCOUNTER — Encounter: Payer: BLUE CROSS/BLUE SHIELD | Attending: Family Medicine | Admitting: Dietician

## 2018-04-19 VITALS — Ht 68.0 in | Wt 234.7 lb

## 2018-04-19 DIAGNOSIS — Z6835 Body mass index (BMI) 35.0-35.9, adult: Secondary | ICD-10-CM | POA: Diagnosis not present

## 2018-04-19 DIAGNOSIS — E6609 Other obesity due to excess calories: Secondary | ICD-10-CM | POA: Diagnosis not present

## 2018-04-19 NOTE — Progress Notes (Signed)
Medical Nutrition Therapy:  Visit start time:9:00am   end time: 10:00  Assessment:  Diagnosis: obesity  Past medical history: elevated cholesterol   Psychosocial issues/ stress concerns:  Rates her stress as moderate and indicates "ok" as to how well she is dealing with her stress.  Preferred learning method:  . Hands-on  Current weight: 234.7 lbs Height: 68 in Medications, supplements: see list  Progress and evaluation:  Patient in for initial medical nutrition therapy visit. She reports her weight was relatively stable at 200 lbs for 8 years. She states that since having her gall bladder removed last January, she has gained approximately 35 lbs. When asked, she stated that her problem area related to diet is "eating out". She picks up a fast food breakfast M-F on her way to work. She eats lunch "out", M-F, often making high fat choices. Most of her dinner meals are prepared at home and consist of a meat, starch, and vegetable and salad. She rarely snacks after dinner. She sometimes snacks mid-morning. Meal times ar 8:30am, 2 or 3:00 pm and 6-7:00pm. Her beverages are 2-3 cups of water daily and 6-7 cups of coffee with splenda. Her diet is low in fruits and vegetables even though she likes a variety of each.  She experiences symptoms of low blood sugar if meals are delayed and has a family history of Type 2 diabetes. Her labs 2/'19 indicated glucose within a normal range and total cholesterol and LDL were elevated.  Physical activity: none; has a treadmill and states she plans to use after the holidays.   Nutrition Care Education:  Weight control: Instructed on a meal plan based on 1700 calories including carbohydrate counting and how to better balance carbohydrate, lean protein and non-starchy vegetables. Discussed simple lunch meals she could prepare at home. Also, discussed healthier choices when dining "out". Encouraged to use the meal plan as a guide to practice mindful eating  verses a overly restrictive eating pattern.  Gave and reviewed sample menus.  Nutritional Diagnosis:  [place]-3.3 Overweight/obesity As related to frequent dining out with high fat choices and no structured physical activity.  As evidenced by diet and exercise history..  Intervention: Balance meals with 2-4 oz lean protein (Refer to list), 2-4 servings of carbohydrate and non-starchy vegetables. Increase intake of fruits and non-starchy vegetables. Think of food guide plate- 1/4 plate protein, 1/4 plate starch and 1/2 plate non-starchy vegetables and fruit. Start taking lunch  to work verses eating "out". Read labels for carbohydrate and for saturated fat (goal of no more than 12 gms daily). Exercise goal: walk daily on treadmill at home and walk on 15 minute breaks.  Education Materials given:  . Plate Planner . Food lists/ Planning A Balanced Meal . Combination foods . Sample meal pattern/ menus . Snacking handout . Goals/ instructions  Learner/ who was taught:  . Patient   Level of understanding: . Partial understanding; needs review/ practice  Demonstrated degree of understanding via:   Teach back Learning barriers: . None Willingness to learn/ readiness for change: . Acceptance, ready for change  Monitoring and Evaluation:  Dietary intake, exercise, , and body weight      follow up: 05/17/18 at 3:30pm

## 2018-04-19 NOTE — Patient Instructions (Addendum)
Balance meals with 2-4 oz lean protein (Refer to list), 2-4 servings of carbohydrate and non-starchy vegetables. Increase intake of fruits and non-starchy vegetables. Think of food guide plate- 1/4 plate protein, 1/4 plate starch and 1/2 plate non-starchy vegetables and fruit. Start taking lunch  to work verses eating "out". Read labels for carbohydrate and for saturated fat (goal of no more than 12 gms daily). Exercise goal: walk daily on treadmill at home and walk on 15 minute breaks.

## 2018-05-17 ENCOUNTER — Ambulatory Visit: Payer: BLUE CROSS/BLUE SHIELD | Admitting: Dietician

## 2018-05-31 ENCOUNTER — Encounter: Payer: Self-pay | Admitting: Dietician

## 2018-07-04 ENCOUNTER — Other Ambulatory Visit: Payer: Self-pay | Admitting: Internal Medicine

## 2018-07-04 DIAGNOSIS — Z1231 Encounter for screening mammogram for malignant neoplasm of breast: Secondary | ICD-10-CM

## 2018-11-07 ENCOUNTER — Encounter: Payer: Self-pay | Admitting: Internal Medicine

## 2018-11-07 ENCOUNTER — Ambulatory Visit: Payer: 59 | Admitting: Internal Medicine

## 2018-11-07 ENCOUNTER — Telehealth: Payer: Self-pay | Admitting: General Practice

## 2018-11-07 ENCOUNTER — Other Ambulatory Visit: Payer: BLUE CROSS/BLUE SHIELD

## 2018-11-07 ENCOUNTER — Other Ambulatory Visit: Payer: Self-pay

## 2018-11-07 VITALS — BP 123/85 | HR 106 | Temp 98.0°F | Resp 16 | Ht 68.0 in | Wt 244.0 lb

## 2018-11-07 DIAGNOSIS — Z20822 Contact with and (suspected) exposure to covid-19: Secondary | ICD-10-CM

## 2018-11-07 DIAGNOSIS — B349 Viral infection, unspecified: Secondary | ICD-10-CM

## 2018-11-07 DIAGNOSIS — K219 Gastro-esophageal reflux disease without esophagitis: Secondary | ICD-10-CM

## 2018-11-07 DIAGNOSIS — B001 Herpesviral vesicular dermatitis: Secondary | ICD-10-CM

## 2018-11-07 NOTE — Progress Notes (Signed)
S - Patient presents with nausea starting Wed, then Friday had loose BM's, felt off with upset stomach and achy over the weekend. Fatigued. No fevers, Mild cough but blamed that on stopping smoking and denied any productive cough, no sore throats, feels like neck swollen on left with a possible lymph node swollen in the back, + HA No bad abdominal pains, feels occas heartburn sx's up into mid chest Had a cold sore on her right lip that started over the weekend Went to work this am.  Was in [place] two weeks ago visiting friends and at the beach Lives at home with husband and two kids  + tob hx, quit 04/2018  + h/o GERD  Current Outpatient Medications on File Prior to Visit  Medication Sig Dispense Refill  . pantoprazole (PROTONIX) 40 MG tablet Take 40 mg by mouth daily.     No current facility-administered medications on file prior to visit.      No Known Allergies   O - NAD, masked  BP 123/85 (BP Location: Right Arm, Patient Position: Sitting, Cuff Size: Large)   Pulse (!) 106   Temp 98 F (36.7 C) (Oral)   Resp 16   Ht 5\' 8"  (1.727 m)   Wt 244 lb (110.7 kg)   SpO2 97%   BMI 37.10 kg/m     HEENT - conj - non-inj'ed, no focal sinus tenderness, + soreness when palpated sinuses, TM's and EAC's clear, pharynx - without exudates, + cold sore on outer right lower lip Neck - no marked anterior cervical nodes, shotty left posterior cervical nodes, mildly tender very proximal chain post neck, no rigidity Pulm- CTA, no focal wheezes, no rales, Abd - obese, soft and NT in UQ 's (notes some discomfort at times up sternum -"heartburn") Affect not flat, approp with conversation  Ass - Prob viral illness, concern for Covid expressed (and seemed to not answer all the questions during the screening process, especially the travel)   HSV - outer lip (cold sore) - has been now > 48 hours  GERD  Plan - Educated and why concerned for Covid and will order testing, they will call her today with the  time to get tested via the drive thru in [place] through [facility]  Not return to work, and Isolation needed at home as await testing results. Importance of this with her living situation noted.  Symptomatic measures with OTC products prn such as acetaminophen products prn, Pepto prn Rest and increased fluids Contagious concerns addressed Can cont the PPI for her GERD sx's Not feel addition of oral anti-virals best presently for cold sore  Await results and if +, aware the HD will be notified and involved with contacts

## 2018-11-07 NOTE — Addendum Note (Signed)
Addended by: Denman George on: 11/07/2018 11:27 AM   Modules accepted: Orders

## 2018-11-07 NOTE — Telephone Encounter (Signed)
Pt has been scheduled for covid testing.  Pt was referred by: Towanda Malkin, MD

## 2018-11-10 LAB — NOVEL CORONAVIRUS, NAA: SARS-CoV-2, NAA: NOT DETECTED

## 2018-11-14 ENCOUNTER — Telehealth: Payer: Self-pay

## 2018-11-14 NOTE — Telephone Encounter (Signed)
Called pt to advise Covid testing was Neg. Pt states that she has no sx but diarrhea, no headache and no fever.   Pt would like to know if she can return to work tomorrow

## 2018-11-14 NOTE — Telephone Encounter (Signed)
Pt aware and work Teacher, adult education.

## 2018-11-14 NOTE — Telephone Encounter (Signed)
Yes, she can return to work tomorrow. Needs to stay well hydrated. If her diarrhea persists or if develops other sx's in association (like fevers, N/V, blood with it, abdominal pains), she should F/u.

## 2018-12-20 ENCOUNTER — Encounter: Payer: Self-pay | Admitting: Physician Assistant

## 2018-12-20 ENCOUNTER — Other Ambulatory Visit: Payer: Self-pay

## 2018-12-20 ENCOUNTER — Ambulatory Visit: Payer: 59 | Admitting: Physician Assistant

## 2018-12-20 VITALS — BP 112/78 | HR 118 | Temp 97.7°F | Resp 14 | Ht 68.0 in | Wt 245.0 lb

## 2018-12-20 DIAGNOSIS — M25519 Pain in unspecified shoulder: Secondary | ICD-10-CM | POA: Insufficient documentation

## 2018-12-20 DIAGNOSIS — M25512 Pain in left shoulder: Secondary | ICD-10-CM

## 2018-12-20 MED ORDER — IBUPROFEN 200 MG PO TABS: 400.0000 mg | ORAL_TABLET | Freq: Three times a day (TID) | ORAL | 0 refills | Status: AC | PRN

## 2018-12-20 MED ORDER — ACETAMINOPHEN 325 MG PO TABS: 650.0000 mg | ORAL_TABLET | Freq: Four times a day (QID) | ORAL | Status: AC | PRN

## 2018-12-20 MED ORDER — ACETAMINOPHEN 325 MG PO TABS: 325.0000 mg | ORAL_TABLET | Freq: Once | ORAL | Status: DC

## 2018-12-20 NOTE — Patient Instructions (Signed)
Shoulder Pain Many things can cause shoulder pain, including:  An injury.  Moving the shoulder in the same way again and again (overuse).  Joint pain (arthritis). Pain can come from:  Swelling and irritation (inflammation) of any part of the shoulder.  An injury to the shoulder joint.  An injury to: ? Tissues that connect muscle to bone (tendons). ? Tissues that connect bones to each other (ligaments). ? Bones. Follow these instructions at home: Watch for changes in your symptoms. Let your doctor know about them. Follow these instructions to help with your pain. If you have a sling:  Wear the sling as told by your doctor. Remove it only as told by your doctor.  Loosen the sling if your fingers: ? Tingle. ? Become numb. ? Turn cold and blue.  Keep the sling clean.  If the sling is not waterproof: ? Do not let it get wet. ? Take the sling off when you shower or bathe. Managing pain, stiffness, and swelling   If told, put ice on the painful area: ? Put ice in a plastic bag. ? Place a towel between your skin and the bag. ? Leave the ice on for 20 minutes, 2-3 times a day. Stop putting ice on if it does not help with the pain.  Squeeze a soft ball or a foam pad as much as possible. This prevents swelling in the shoulder. It also helps to strengthen the arm. General instructions  Take over-the-counter and prescription medicines only as told by your doctor.  Keep all follow-up visits as told by your doctor. This is important. Contact a doctor if:  Your pain gets worse.  Medicine does not help your pain.  You have new pain in your arm, hand, or fingers. Get help right away if:  Your arm, hand, or fingers: ? Tingle. ? Are numb. ? Are swollen. ? Are painful. ? Turn white or blue. Summary  Shoulder pain can be caused by many things. These include injury, moving the shoulder in the same away again and again, and joint pain.  Watch for changes in your symptoms.  Let your doctor know about them.  This condition may be treated with a sling, ice, and pain medicine.  Contact your doctor if the pain gets worse or you have new pain. Get help right away if your arm, hand, or fingers tingle or get numb, swollen, or painful.  Keep all follow-up visits as told by your doctor. This is important. This information is not intended to replace advice given to you by your health care provider. Make sure you discuss any questions you have with your health care provider. Document Released: 09/30/2007 Document Revised: 10/26/2017 Document Reviewed: 10/26/2017 Elsevier Patient Education  2020 [employer]vier Inc.  

## 2018-12-20 NOTE — Progress Notes (Signed)
   Subjective:    Patient ID: [name], female    DOB: 1971-04-09, 48 y.o.   MRN: [id number]  HPI  48 yo WF known to [place]. Presents c/o left shoulder pain present since last evening when she lifted a box ( guesstimated 10 pounds) to upper shelf in garage. Felt immediate tenderness front of left shoulder and ROM limitations. Anxious. Hx of bursitis in same shoulder years past."felt a lot like this" Has had some tingling in fingertips intermittently. Slept well for four hours after ibuprofen but wakened about 1 am with discomfort on change position. Had help with bra closure this morning but otherwise could dress self  Has hx of GI intolerance to NSAIDS if used routinely. Tolerates and occasional dose and took 2 last evening before bed and one this morning about 7 am      Negative except as noted above Objective:   Physical Exam WDWN overweight WF in mild distress left shoulder Right handed New onset poitn tenderness anterior shoulder Can adduct and abduct with limitations and some discomfort noted locally / anterior shoulder. Crossover not accomplished,anxious about causing discomfort Pulses present and equal bilaterally,good cap fill Grip equal bilaterally Hands bilaterally slightly full,she reports mild swelling bilat FROM at wrist and elbow No bruising noted shoulder or arm Sensation intact full length of arm and hand/finger     Assessment & Plan:       Left shoulder pain, acute onset  Encourage cold packs /frozen peas to the area of tenderness anterior left shoulder intermittent about 20 min /hour. Tylenol 2 po now and repeat 4-6 hours. If adequate relief continue....may add alternate dose of 2 motrin intermittently if needed. To stop with any evidence GI upset. Given sample warm wrap to apply to shoulder during night  For comfort Rachelle Hora

## 2018-12-22 ENCOUNTER — Other Ambulatory Visit: Payer: Self-pay

## 2018-12-22 ENCOUNTER — Encounter: Payer: Self-pay | Admitting: Physician Assistant

## 2018-12-22 ENCOUNTER — Ambulatory Visit: Payer: 59 | Admitting: Physician Assistant

## 2018-12-22 VITALS — BP 116/76 | HR 107 | Temp 97.9°F | Resp 16 | Ht 68.0 in | Wt 250.0 lb

## 2018-12-22 DIAGNOSIS — S46012A Strain of muscle(s) and tendon(s) of the rotator cuff of left shoulder, initial encounter: Secondary | ICD-10-CM | POA: Diagnosis not present

## 2018-12-22 DIAGNOSIS — M25512 Pain in left shoulder: Secondary | ICD-10-CM

## 2018-12-22 MED ORDER — PANTOPRAZOLE SODIUM 40 MG PO TBEC: 40.0000 mg | DELAYED_RELEASE_TABLET | Freq: Every day | ORAL | 0 refills | Status: DC

## 2018-12-22 NOTE — Progress Notes (Signed)
  Subjective:     Patient ID: [name], female   DOB: 1970-08-15, 48 y.o.   MRN: [id number]  HPI  Left shoulder pain f/u Acute onset 2 days ago lifting box to garage shelf Interim care with alternating heat or ice as preferred Has used some ibuprofen but notes stomach irritation Tylenol not adequate pain relief Has had some improvement with ROM  Distressed that discomfort still present Difficulty sleeping Has felt" popping sound/ feeling" in area Reminds her of bursitis episode-is  anxious Recognizes improvement but wants resolution Husband would like Ortho eval   Review of Systems Mild gastritis hx and since NSAID use--discontinue Hx left shoulder bursitis years ago    Objective:   Physical Exam WDWN F in mild distress- indicates anterior left shoulder,location unchanged Skin normal- well hydrated-no bruising noted Wearing an OTC pain patch ant shoulder Arm warm -good cap fill- hand swelling has gone down/ rings in place Good grip bilat  FROM wrist  Flexion / rotation FROM elbow- hesitant reports discomfort at shoulder -can flex to 90 and rotate while flexed Adduction accomplished without pain Abduction limited to 35-45 degree- painful shoulder cannot put hand on back at waist level    t  Assessment:     Left shoulder pain    Plan:     Patient is anxious to have discomfort stop, husband would like Ortho eval. Have suggested Ortho Walk In  for eval and to establish with Ortho  Protonix renewed and NSAIDS discouraged Request eval f/u info as avail

## 2018-12-22 NOTE — Progress Notes (Signed)
States about the same as when she saw Emily Filbert, [facility] on Tuesday of this week.  Still hurts & heard it pop one time yesterday, but didn't feel anything. Feels like it's pulling from shouldewr downward - especially towards the front but some towards back .  AMD

## 2018-12-22 NOTE — Patient Instructions (Signed)
Shoulder Pain Many things can cause shoulder pain, including:  An injury.  Moving the shoulder in the same way again and again (overuse).  Joint pain (arthritis). Pain can come from:  Swelling and irritation (inflammation) of any part of the shoulder.  An injury to the shoulder joint.  An injury to: ? Tissues that connect muscle to bone (tendons). ? Tissues that connect bones to each other (ligaments). ? Bones. Follow these instructions at home: Watch for changes in your symptoms. Let your doctor know about them. Follow these instructions to help with your pain. If you have a sling:  Wear the sling as told by your doctor. Remove it only as told by your doctor.  Loosen the sling if your fingers: ? Tingle. ? Become numb. ? Turn cold and blue.  Keep the sling clean.  If the sling is not waterproof: ? Do not let it get wet. ? Take the sling off when you shower or bathe. Managing pain, stiffness, and swelling   If told, put ice on the painful area: ? Put ice in a plastic bag. ? Place a towel between your skin and the bag. ? Leave the ice on for 20 minutes, 2-3 times a day. Stop putting ice on if it does not help with the pain.  Squeeze a soft ball or a foam pad as much as possible. This prevents swelling in the shoulder. It also helps to strengthen the arm. General instructions  Take over-the-counter and prescription medicines only as told by your doctor.  Keep all follow-up visits as told by your doctor. This is important. Contact a doctor if:  Your pain gets worse.  Medicine does not help your pain.  You have new pain in your arm, hand, or fingers. Get help right away if:  Your arm, hand, or fingers: ? Tingle. ? Are numb. ? Are swollen. ? Are painful. ? Turn white or blue. Summary  Shoulder pain can be caused by many things. These include injury, moving the shoulder in the same away again and again, and joint pain.  Watch for changes in your symptoms.  Let your doctor know about them.  This condition may be treated with a sling, ice, and pain medicine.  Contact your doctor if the pain gets worse or you have new pain. Get help right away if your arm, hand, or fingers tingle or get numb, swollen, or painful.  Keep all follow-up visits as told by your doctor. This is important. This information is not intended to replace advice given to you by your health care provider. Make sure you discuss any questions you have with your health care provider. Document Released: 09/30/2007 Document Revised: 10/26/2017 Document Reviewed: 10/26/2017 Elsevier Patient Education  2020 [employer]vier Inc.  

## 2019-03-06 ENCOUNTER — Other Ambulatory Visit: Payer: Self-pay

## 2019-03-06 ENCOUNTER — Ambulatory Visit: Payer: 59 | Admitting: Physician Assistant

## 2019-03-06 ENCOUNTER — Encounter: Payer: Self-pay | Admitting: Physician Assistant

## 2019-03-06 VITALS — BP 122/88 | HR 105 | Temp 98.5°F | Resp 16 | Ht 68.0 in | Wt 250.0 lb

## 2019-03-06 DIAGNOSIS — B9689 Other specified bacterial agents as the cause of diseases classified elsewhere: Secondary | ICD-10-CM

## 2019-03-06 DIAGNOSIS — K121 Other forms of stomatitis: Secondary | ICD-10-CM

## 2019-03-06 DIAGNOSIS — Z760 Encounter for issue of repeat prescription: Secondary | ICD-10-CM

## 2019-03-06 DIAGNOSIS — E669 Obesity, unspecified: Secondary | ICD-10-CM | POA: Insufficient documentation

## 2019-03-06 DIAGNOSIS — K0889 Other specified disorders of teeth and supporting structures: Secondary | ICD-10-CM

## 2019-03-06 DIAGNOSIS — Z6839 Body mass index (BMI) 39.0-39.9, adult: Secondary | ICD-10-CM | POA: Insufficient documentation

## 2019-03-06 MED ORDER — TRAMADOL HCL 50 MG PO TABS: 50.0000 mg | ORAL_TABLET | Freq: Three times a day (TID) | ORAL | 0 refills | Status: AC | PRN

## 2019-03-06 MED ORDER — PANTOPRAZOLE SODIUM 40 MG PO TBEC: 40.0000 mg | DELAYED_RELEASE_TABLET | Freq: Every day | ORAL | 3 refills | Status: DC

## 2019-03-06 MED ORDER — SULFAMETHOXAZOLE-TRIMETHOPRIM 800-160 MG PO TABS: 1.0000 | ORAL_TABLET | Freq: Two times a day (BID) | ORAL | 0 refills | Status: DC

## 2019-03-06 MED ORDER — METHYLPREDNISOLONE 4 MG PO TBPK: ORAL_TABLET | ORAL | 0 refills | Status: DC

## 2019-03-06 NOTE — Progress Notes (Signed)
Ulcer under left side of tongue.  States has been there over a week.   Went to her regular dentist & sent to [facility] in [place]. Her dentist thought it was a piece of broken bone.  Xrays (2D) done at her dentist office & said he couldn't see anything & that's why he sent her to specialist because they can do 4D imaging. Specialist oral surgeon didn't do any xrays.  Said it's an ulcer & gave her numbing medication.  Neither provider treated with ABX. Has been doing salt water gargles & using peroxide based mouthwash without results. Taking tylenol. Can't take iibuprofen - it causes upset stomach.   Now the are is still sore. Can't chew food.  AMD

## 2019-03-06 NOTE — Progress Notes (Signed)
   Subjective:medication refill/dental pain    Patient ID: [name], female    DOB: [birth year], 48 y.o.   MRN: [id number]  HPI Patient request refill of Protonix. Patient also complain of dental pain due to mouth ulcer for 6 days. Patient has seen dentist and oral surgeon confirming dental ulcer. Patient states mild relief with viscous lidocaine.Third compliant concern axillary and xxternal vaginal abscess due to shaving. Lesions are not drainage fro lesion.   Review of Systems    Esophageal dysphagia. Obesity Objective:   Physical Exam Ulcer lesion left lower gingival area. Mild edema and erythema bilateral axillary area. Vaginal exam deferred.       Assessment & Plan:  Medication refill Dental pain Mild staph infection  Refill Protonix. Prescription for Bactrim DS, Medrol dosepak, and Tramadol.  Follow up with personal Dentist.

## 2019-06-15 ENCOUNTER — Ambulatory Visit: Payer: Self-pay

## 2019-06-16 ENCOUNTER — Other Ambulatory Visit: Payer: Self-pay

## 2019-06-16 ENCOUNTER — Ambulatory Visit: Payer: 59 | Admitting: Physician Assistant

## 2019-06-16 VITALS — BP 102/68 | HR 114 | Temp 97.9°F | Ht 68.0 in | Wt 256.0 lb

## 2019-06-16 DIAGNOSIS — M1990 Unspecified osteoarthritis, unspecified site: Secondary | ICD-10-CM

## 2019-06-16 DIAGNOSIS — L989 Disorder of the skin and subcutaneous tissue, unspecified: Secondary | ICD-10-CM

## 2019-06-16 NOTE — Progress Notes (Signed)
   Subjective:    Patient ID: [name], female    DOB: 10-19-1970, 49 y.o.   MRN: [id number]  HPI  49 yo F presents with new onset small lesion left chest wall  sun exposed area, has been "messing with it" and it has become slightly tender and larger   Also notes skin tags right and left neck where collareded shirts contact frequently, two on left have become darker  Reports "ring worm " on lower legs she has been treating with coconut oil without resolution and she believes seem to be increasing  Also concerned : New onset tender nodules distal joint finger tips both hands  Increased tenderness, size over past 3-4 months Previously only one finger Remembers Grandads hands as having the same and becoming more contracted over the course of his older years  Anxious to understand and to "fix"  Review of Systems As noted above    Objective:   Physical Exam  3 mm  Flesh colored horny outgrowth  left chest wall Area with sun exposure/damage Multiple hyperpigmented moles and tiny seborrheic keratosis in surrounding  Area Sun damage bilateral forearms  Heberdens nodules suspected both hands- currently with good ROM joints but reports tenderness markedly increasing . Mild pink inflammation noted  Bilateral LEs with  multiple darkened lesions fingertip size and smaller Flaky surface , commonly oriented to hair - no obvious central clearing Suspect satellite lesions possibly psoriatic but denies known hx     Assessment & Plan:  Alie has multiple skin based issues at present and has been many years since seeing Derm.  Raised verrucal type lesion of chest wall is particularly frustrating and  She picks at it - wishes Rx. Choices limited in clinic - have opted very small salacyclic acid [id number] pad modified from  corn plaster. Leave in place for 48 hours, longer if tolerated without difficulty. Should it become irritated or inflamed  Refer back to [facility] for full skin screening and  evaluation lesions   Referral to RHEUMATOLOGY  for evaluation and management arthritis  RTC PRN re-eval-- we can continue labs and medications if reccommended by  Consultants as desired Routine yearly f/u continues here as well Patient will report back with feedback

## 2019-06-21 ENCOUNTER — Ambulatory Visit: Payer: Self-pay

## 2019-07-04 ENCOUNTER — Telehealth: Payer: Self-pay

## 2019-07-04 DIAGNOSIS — L989 Disorder of the skin and subcutaneous tissue, unspecified: Secondary | ICD-10-CM

## 2019-07-04 DIAGNOSIS — M1990 Unspecified osteoarthritis, unspecified site: Secondary | ICD-10-CM

## 2019-07-04 NOTE — Telephone Encounter (Signed)
Referral for Dermatology faxed to [place]. Referral for Rheumatology faxed to [facility].  AMD

## 2019-08-02 DIAGNOSIS — M25642 Stiffness of left hand, not elsewhere classified: Secondary | ICD-10-CM | POA: Diagnosis not present

## 2019-08-02 DIAGNOSIS — E669 Obesity, unspecified: Secondary | ICD-10-CM | POA: Diagnosis not present

## 2019-08-02 DIAGNOSIS — M1712 Unilateral primary osteoarthritis, left knee: Secondary | ICD-10-CM | POA: Diagnosis not present

## 2019-08-02 DIAGNOSIS — Z1159 Encounter for screening for other viral diseases: Secondary | ICD-10-CM | POA: Diagnosis not present

## 2019-08-02 DIAGNOSIS — M19041 Primary osteoarthritis, right hand: Secondary | ICD-10-CM | POA: Diagnosis not present

## 2019-08-02 DIAGNOSIS — M2550 Pain in unspecified joint: Secondary | ICD-10-CM | POA: Diagnosis not present

## 2019-08-02 DIAGNOSIS — M7989 Other specified soft tissue disorders: Secondary | ICD-10-CM | POA: Diagnosis not present

## 2019-08-02 DIAGNOSIS — M19012 Primary osteoarthritis, left shoulder: Secondary | ICD-10-CM | POA: Diagnosis not present

## 2019-08-07 NOTE — Progress Notes (Addendum)
Scheduled to complete physical 09/04/19.  Labs - Dr. Marlowe Sax  AMD

## 2019-08-08 ENCOUNTER — Ambulatory Visit: Payer: Self-pay

## 2019-08-08 ENCOUNTER — Other Ambulatory Visit: Payer: Self-pay

## 2019-08-08 DIAGNOSIS — Z0000 Encounter for general adult medical examination without abnormal findings: Secondary | ICD-10-CM

## 2019-08-08 LAB — POCT URINALYSIS DIPSTICK
Bilirubin, UA: NEGATIVE
Glucose, UA: NEGATIVE
Ketones, UA: NEGATIVE
Leukocytes, UA: NEGATIVE
Nitrite, UA: NEGATIVE
Protein, UA: NEGATIVE
Spec Grav, UA: >=1.03 — AB (ref 1.010–1.025)
Urobilinogen, UA: 0.2 E.U./dL
pH, UA: 5.5 (ref 5.0–8.0)

## 2019-08-09 LAB — MICROSCOPIC EXAMINATION: Casts: NONE SEEN /lpf

## 2019-08-09 LAB — URINALYSIS, COMPLETE
Bilirubin, UA: NEGATIVE
Glucose, UA: NEGATIVE
Ketones, UA: NEGATIVE
Leukocytes,UA: NEGATIVE
Nitrite, UA: NEGATIVE
Protein,UA: NEGATIVE
RBC, UA: NEGATIVE
Specific Gravity, UA: 1.026 (ref 1.005–1.030)
Urobilinogen, Ur: 0.2 mg/dL (ref 0.2–1.0)
pH, UA: 5 (ref 5.0–7.5)

## 2019-08-10 LAB — CMP12+LP+TP+TSH+6AC+CBC/D/PLT
ALT: 16 IU/L (ref 0–32)
AST: 26 IU/L (ref 0–40)
Albumin/Globulin Ratio: 1.8 (ref 1.2–2.2)
Albumin: 4.6 g/dL (ref 3.8–4.8)
Alkaline Phosphatase: 85 IU/L (ref 39–117)
BUN/Creatinine Ratio: 19 (ref 9–23)
BUN: 11 mg/dL (ref 6–24)
Basophils Absolute: 0.1 10*3/uL (ref 0.0–0.2)
Basos: 1 %
Bilirubin Total: 0.5 mg/dL (ref 0.0–1.2)
Calcium: 9.5 mg/dL (ref 8.7–10.2)
Chloride: 103 mmol/L (ref 96–106)
Chol/HDL Ratio: 3.9 ratio (ref 0.0–4.4)
Cholesterol, Total: 201 mg/dL — ABNORMAL HIGH (ref 100–199)
Creatinine, Ser: 0.59 mg/dL (ref 0.57–1.00)
EOS (ABSOLUTE): 0.1 10*3/uL (ref 0.0–0.4)
Eos: 2 %
Estimated CHD Risk: 0.8 times avg. (ref 0.0–1.0)
Free Thyroxine Index: 1.9 (ref 1.2–4.9)
GFR calc Af Amer: 125 mL/min/{1.73_m2} (ref >59)
GFR calc non Af Amer: 109 mL/min/{1.73_m2} (ref >59)
GGT: 89 IU/L — ABNORMAL HIGH (ref 0–60)
Globulin, Total: 2.5 g/dL (ref 1.5–4.5)
Glucose: 107 mg/dL — ABNORMAL HIGH (ref 65–99)
HDL: 51 mg/dL (ref >39)
Hematocrit: 42.8 % (ref 34.0–46.6)
Hemoglobin: 14.6 g/dL (ref 11.1–15.9)
Immature Grans (Abs): 0 10*3/uL (ref 0.0–0.1)
Immature Granulocytes: 0 %
Iron: 78 ug/dL (ref 27–159)
LDH: 180 IU/L (ref 119–226)
LDL Chol Calc (NIH): 124 mg/dL — ABNORMAL HIGH (ref 0–99)
Lymphocytes Absolute: 2.1 10*3/uL (ref 0.7–3.1)
Lymphs: 29 %
MCH: 29 pg (ref 26.6–33.0)
MCHC: 34.1 g/dL (ref 31.5–35.7)
MCV: 85 fL (ref 79–97)
Monocytes Absolute: 0.7 10*3/uL (ref 0.1–0.9)
Monocytes: 9 %
Neutrophils Absolute: 4.3 10*3/uL (ref 1.4–7.0)
Neutrophils: 59 %
Phosphorus: 3.5 mg/dL (ref 3.0–4.3)
Platelets: 243 10*3/uL (ref 150–450)
Potassium: 4.6 mmol/L (ref 3.5–5.2)
RBC: 5.03 x10E6/uL (ref 3.77–5.28)
RDW: 12.2 % (ref 11.7–15.4)
Sodium: 139 mmol/L (ref 134–144)
T3 Uptake Ratio: 26 % (ref 24–39)
T4, Total: 7.4 ug/dL (ref 4.5–12.0)
TSH: 1.16 u[IU]/mL (ref 0.450–4.500)
Total Protein: 7.1 g/dL (ref 6.0–8.5)
Triglycerides: 145 mg/dL (ref 0–149)
Uric Acid: 5.7 mg/dL (ref 2.6–6.2)
VLDL Cholesterol Cal: 26 mg/dL (ref 5–40)
WBC: 7.4 10*3/uL (ref 3.4–10.8)

## 2019-08-10 LAB — SEDIMENTATION RATE: Sed Rate: 26 mm/hr (ref 0–32)

## 2019-08-10 LAB — RHEUMATOID FACTOR: Rheumatoid fact SerPl-aCnc: 11.3 IU/mL (ref 0.0–13.9)

## 2019-08-10 LAB — HCV COMMENT:

## 2019-08-10 LAB — C-REACTIVE PROTEIN: CRP: 8 mg/L (ref 0–10)

## 2019-08-10 LAB — CYCLIC CITRUL PEPTIDE ANTIBODY, IGG/IGA: Cyclic Citrullin Peptide Ab: 3 U (ref 0–19)

## 2019-08-10 LAB — HEPATITIS C ANTIBODY (REFLEX): HCV Ab: <0.1 s/co ratio (ref 0.0–0.9)

## 2019-08-18 ENCOUNTER — Ambulatory Visit: Payer: Self-pay

## 2019-08-18 ENCOUNTER — Telehealth: Payer: Self-pay

## 2019-08-18 ENCOUNTER — Other Ambulatory Visit: Payer: Self-pay

## 2019-08-18 VITALS — Temp 98.4°F

## 2019-08-18 DIAGNOSIS — R8290 Unspecified abnormal findings in urine: Secondary | ICD-10-CM

## 2019-08-18 LAB — POCT URINALYSIS DIPSTICK
Bilirubin, UA: NEGATIVE
Blood, UA: NEGATIVE
Glucose, UA: NEGATIVE
Ketones, UA: NEGATIVE
Leukocytes, UA: NEGATIVE
Nitrite, UA: NEGATIVE
Odor: POSITIVE
Protein, UA: NEGATIVE
Spec Grav, UA: 1.025 (ref 1.010–1.025)
Urobilinogen, UA: 0.2 E.U./dL
pH, UA: 6 (ref 5.0–8.0)

## 2019-08-18 NOTE — Progress Notes (Signed)
Presents C/O S/X x2 2 days of strong odor to urine & slight pelvic discomfort. Denies low back pain. Denies burning with urination. No increased frequency in urination. No changes in voiding pattern. No changes in medications. Afebrile  Message sent to Randel Pigg, PA-C - on-call provider for the clinic today.  AMD

## 2019-08-18 NOTE — Addendum Note (Signed)
Addended by: Aliene Altes on: 08/18/2019 04:24 PM   Modules accepted: Orders

## 2019-08-18 NOTE — Telephone Encounter (Signed)
Presents to clinic C/O UTI S/Sx - odor to urine & slight pelvic discomfort. Denies low back pain. Denies burning with urination. Denies increased frequency. No change in voiding pattern. No new medications.  She was here last week for a urinalysis for upcoming physical. Had her to do another one today & no blood, leukocytes or nitrites. Urine is yellow & cloudy.  Do you want specimen sent for culture?  Advised that some providers treat based on s/sx & some wait for culture results.  Verbalized understanding.  Please advise.  AMD

## 2019-08-20 LAB — URINE CULTURE

## 2019-08-21 DIAGNOSIS — M2550 Pain in unspecified joint: Secondary | ICD-10-CM | POA: Diagnosis not present

## 2019-08-21 DIAGNOSIS — M159 Polyosteoarthritis, unspecified: Secondary | ICD-10-CM | POA: Insufficient documentation

## 2019-08-21 DIAGNOSIS — E669 Obesity, unspecified: Secondary | ICD-10-CM | POA: Diagnosis not present

## 2019-08-21 DIAGNOSIS — M150 Primary generalized (osteo)arthritis: Secondary | ICD-10-CM | POA: Insufficient documentation

## 2019-08-21 DIAGNOSIS — M8949 Other hypertrophic osteoarthropathy, multiple sites: Secondary | ICD-10-CM | POA: Diagnosis not present

## 2019-09-04 ENCOUNTER — Other Ambulatory Visit: Payer: Self-pay

## 2019-09-04 ENCOUNTER — Encounter: Payer: Self-pay | Admitting: Physician Assistant

## 2019-09-04 ENCOUNTER — Ambulatory Visit: Payer: 59 | Admitting: Physician Assistant

## 2019-09-04 VITALS — BP 118/77 | HR 108 | Temp 98.3°F | Resp 16 | Ht 68.0 in | Wt 255.0 lb

## 2019-09-04 DIAGNOSIS — Z0000 Encounter for general adult medical examination without abnormal findings: Secondary | ICD-10-CM

## 2019-09-04 NOTE — Progress Notes (Signed)
   Subjective: Annual physical exam    Patient ID: [name], female    DOB: May 21, 1970, 49 y.o.   MRN: [id number]  HPI Patient presents for annual physical exam.  Patient reports concerns for esophageal dysphagia.  Patient states she was prescribed Protonix after having upper endoscopic exam.  Review of Systems    Esophageal dysphagia, hyperplastic colon polyps, morbid obesity and oral ulcers.   Objective:   Physical Exam No acute distress.  Overweight for height.  HEENT unremarkable.  Neck is supple for adenopathy or bruits.  Lungs are clear to auscultation.  Heart regular rate and rhythm.  Abdomen mildly obese appearance.  Negative HSM, normoactive bowel sounds, soft and nontender to palpation.  No obvious deformity to the upper or lower extremities.  Patient full neck range of motion upper and lower extremities.  No gross deformity to cervical lumbar spine.  Patient is full neck range of motion of cervical lumbar spine.  Cranial nerves II through XII grossly intact.    Assessment & Plan: Well exam.  Patient wished to discuss weight loss medication or for doctor.  Discussed lab results with patient.  Patient requests referral  to gastroenterologist due to dysphagia and no relief with Protonix.  Discussed lab results with patient.

## 2019-09-13 ENCOUNTER — Other Ambulatory Visit: Payer: Self-pay | Admitting: Physician Assistant

## 2019-10-11 ENCOUNTER — Other Ambulatory Visit: Payer: Self-pay

## 2019-10-11 ENCOUNTER — Ambulatory Visit (INDEPENDENT_AMBULATORY_CARE_PROVIDER_SITE_OTHER): Payer: 59 | Admitting: Dermatology

## 2019-10-11 DIAGNOSIS — L578 Other skin changes due to chronic exposure to nonionizing radiation: Secondary | ICD-10-CM | POA: Diagnosis not present

## 2019-10-11 DIAGNOSIS — L821 Other seborrheic keratosis: Secondary | ICD-10-CM | POA: Diagnosis not present

## 2019-10-11 DIAGNOSIS — D485 Neoplasm of uncertain behavior of skin: Secondary | ICD-10-CM | POA: Diagnosis not present

## 2019-10-11 DIAGNOSIS — Q828 Other specified congenital malformations of skin: Secondary | ICD-10-CM | POA: Diagnosis not present

## 2019-10-11 DIAGNOSIS — B079 Viral wart, unspecified: Secondary | ICD-10-CM | POA: Diagnosis not present

## 2019-10-11 NOTE — Patient Instructions (Signed)

## 2019-10-11 NOTE — Progress Notes (Signed)
   New Patient Visit  Subjective  [name] is a 49 y.o. female who presents for the following: Other (left chest - gets irritated) and Rash (lower legs x ~1 year. Started on right leg and is now on left leg. She thought it was ringworm).  The following portions of the chart were reviewed this encounter and updated as appropriate:  Tobacco  Allergies  Meds  Problems  Med Hx  Surg Hx  Fam Hx      Review of Systems:  No other skin or systemic complaints except as noted in HPI or Assessment and Plan.  Objective  Well appearing patient in no apparent distress; mood and affect are within normal limits.  A focused examination was performed including chest, arms, legs. Relevant physical exam findings are noted in the Assessment and Plan.  Objective  Left chest: 0.5 cm flesh colored papule with erythema  Objective  Right pretibial: Annular pink flat papules        Assessment & Plan    Neoplasm of uncertain behavior of skin (2) Left chest  Epidermal / dermal shaving  Lesion diameter (cm):  0.5 Informed consent: discussed and consent obtained   Timeout: patient name, date of birth, surgical site, and procedure verified   Procedure prep:  Patient was prepped and draped in usual sterile fashion Prep type:  Isopropyl alcohol Anesthesia: the lesion was anesthetized in a standard fashion   Anesthetic:  1% lidocaine w/ epinephrine 1-100,000 buffered w/ 8.4% NaHCO3 Instrument used: flexible razor blade   Hemostasis achieved with: pressure, aluminum chloride and electrodesiccation   Outcome: patient tolerated procedure well   Post-procedure details: sterile dressing applied and wound care instructions given   Dressing type: bandage and petrolatum   Additional details:  Post treatment defect - 0.9 cm  Specimen 1 - Surgical pathology Differential Diagnosis: Irritated nevus vs SK vs other  Check Margins: No 0.5 cm flesh colored papule with erythema  Right  pretibial  Skin / nail biopsy Type of biopsy: tangential   Informed consent: discussed and consent obtained   Timeout: patient name, date of birth, surgical site, and procedure verified   Procedure prep:  Patient was prepped and draped in usual sterile fashion Prep type:  Isopropyl alcohol Anesthesia: the lesion was anesthetized in a standard fashion   Anesthetic:  1% lidocaine w/ epinephrine 1-100,000 buffered w/ 8.4% NaHCO3 Instrument used: flexible razor blade   Hemostasis achieved with: pressure, aluminum chloride and electrodesiccation   Outcome: patient tolerated procedure well   Post-procedure details: sterile dressing applied and wound care instructions given   Dressing type: bandage and petrolatum    Specimen 2 - Surgical pathology Differential Diagnosis: DSAP vs GA vs other Check Margins: No Annular pink flat papules  Actinic Damage - diffuse scaly erythematous macules with underlying dyspigmentation - Recommend daily broad spectrum sunscreen SPF 30+ to sun-exposed areas, reapply every 2 hours as needed.  - Call for new or changing lesions.  Seborrheic Keratoses - Stuck-on, waxy, tan-brown papules and plaques  - Discussed benign etiology and prognosis. - Observe - Call for any changes  Return if symptoms worsen or fail to improve.  I, Ashok Cordia, CMA, am acting as scribe for Sarina Ser, MD .  Documentation: I have reviewed the above documentation for accuracy and completeness, and I agree with the above.  Sarina Ser, MD

## 2019-10-12 ENCOUNTER — Other Ambulatory Visit: Payer: Self-pay

## 2019-10-12 DIAGNOSIS — Z1231 Encounter for screening mammogram for malignant neoplasm of breast: Secondary | ICD-10-CM

## 2019-10-15 ENCOUNTER — Encounter: Payer: Self-pay | Admitting: Dermatology

## 2019-10-23 ENCOUNTER — Telehealth: Payer: Self-pay

## 2019-10-23 NOTE — Telephone Encounter (Signed)
Order for Screening Mammogram put in Epic on 10/12/19 by S. Stone, Therapist, sports.  Contacted Larna & informed her order in system and if she has any problems scheduling her mammogram to let us know.  AMD

## 2019-10-25 ENCOUNTER — Ambulatory Visit
Admission: RE | Admit: 2019-10-25 | Discharge: 2019-10-25 | Disposition: A | Discharge status: Home / Self Care | Payer: 59 | Source: Ambulatory Visit | Attending: Emergency Medicine | Admitting: Emergency Medicine

## 2019-10-25 DIAGNOSIS — Z1231 Encounter for screening mammogram for malignant neoplasm of breast: Secondary | ICD-10-CM | POA: Diagnosis not present

## 2019-10-31 ENCOUNTER — Telehealth: Payer: Self-pay

## 2019-10-31 NOTE — Telephone Encounter (Signed)
Patient was advised of biopsy results. I had Dr. Nicole Kindred give me the OK since Dr. Nehemiah Massed has not sign off on them yet and he is out of the office this week.

## 2019-11-15 ENCOUNTER — Other Ambulatory Visit: Payer: Self-pay

## 2019-11-15 ENCOUNTER — Encounter: Payer: Self-pay | Admitting: Emergency Medicine

## 2019-11-15 ENCOUNTER — Ambulatory Visit: Payer: Self-pay | Admitting: Emergency Medicine

## 2019-11-15 NOTE — Progress Notes (Signed)
  ER Provider Note       Time seen: 10:56 AM   I have reviewed the vital signs and the nursing notes.  HISTORY   Chief Complaint Weight Loss   HPI [name] is a 49 y.o. female with a history of anxiety, depression, hyperlipidemia, GERD, obesity who presents today for concerns about obesity and seeking direction regarding weight loss.  Patient states she has been changing her diet and exercising regularly and is only lost 2 pounds in several months.  She is concerned because she is tired all the time.  Previously weight loss medications has made her feel bad.  Past Medical History:  Diagnosis Date  . Anxiety   . Depression   . Elevated lipids   . GERD (gastroesophageal reflux disease)   . Obesity     Past Surgical History:  Procedure Laterality Date  . ABDOMINAL HYSTERECTOMY    . APPENDECTOMY    . CHOLECYSTECTOMY    . colonoscopy  2018 or 2019  . LAPAROSCOPIC OOPHERECTOMY    . UPPER GI ENDOSCOPY  2018 or 2019    Allergies Ciprofloxacin   Review of Systems Constitutional: Negative for fever.  Positive for fatigue  cardiovascular: Negative for chest pain. Respiratory: Negative for shortness of breath. Gastrointestinal: Negative for abdominal pain, vomiting and diarrhea. Musculoskeletal: Negative for back pain. Skin: Negative for rash. Neurological: Negative for headaches, focal weakness or numbness.  All systems negative/normal/unremarkable except as stated in the HPI  ____________________________________________   PHYSICAL EXAM:  VITAL SIGNS: Vitals:   11/15/19 1050  BP: 109/71  Pulse: 87  Resp: 14  Temp: 98.2 F (36.8 C)  SpO2: 97%    Constitutional: Alert and oriented. Well appearing and in no distress. Eyes: Conjunctivae are normal. Normal extraocular movements. ENT      Head: Normocephalic and atraumatic.      Nose: No congestion/rhinnorhea.      Mouth/Throat: Mucous membranes are moist.      Neck: No stridor. Cardiovascular: Normal  rate, regular rhythm. No murmurs, rubs, or gallops. Respiratory: Normal respiratory effort without tachypnea nor retractions. Breath sounds are clear and equal bilaterally. No wheezes/rales/rhonchi. Gastrointestinal: Soft and nontender. Normal bowel sounds Musculoskeletal: Nontender with normal range of motion in extremities. No lower extremity tenderness nor edema. Neurologic:  Normal speech and language. No gross focal neurologic deficits are appreciated.  Skin:  Skin is warm, dry and intact. No rash noted. Psychiatric: Speech and behavior are normal.    ASSESSMENT AND PLAN  Obesity   Plan: The patient had presented for concerns about lack of weight loss despite changes in diet and exercise over a period of months.  Clinically she looks well and cannot tolerate medications for weight loss.  She will be referred to [facility] surgery for potential weight loss surgery.  Lenise Arena MD    Note: This note was generated in part or whole with voice recognition software. Voice recognition is usually quite accurate but there are transcription errors that can and very often do occur. I apologize for any typographical errors that were not detected and corrected.

## 2019-11-15 NOTE — Progress Notes (Signed)
Pt wants to talk about loosing weight.  CL,RMA

## 2019-11-16 NOTE — Addendum Note (Signed)
Addended by: Aliene Altes on: 11/16/2019 03:35 PM   Modules accepted: Orders

## 2019-11-16 NOTE — Addendum Note (Signed)
Addended by: Aliene Altes on: 11/16/2019 02:42 PM   Modules accepted: Orders

## 2019-11-30 ENCOUNTER — Ambulatory Visit (INDEPENDENT_AMBULATORY_CARE_PROVIDER_SITE_OTHER): Payer: 59 | Admitting: Dermatology

## 2019-11-30 ENCOUNTER — Other Ambulatory Visit: Payer: Self-pay

## 2019-11-30 DIAGNOSIS — L820 Inflamed seborrheic keratosis: Secondary | ICD-10-CM

## 2019-11-30 DIAGNOSIS — L565 Disseminated superficial actinic porokeratosis (DSAP): Secondary | ICD-10-CM

## 2019-11-30 NOTE — Progress Notes (Signed)
   Follow-Up Visit   Subjective  [name] is a 49 y.o. female who presents for the following: Follow-up (2 months f/u Biopsy proven Actinic Porokeratosis ). Pt here to discuss treatment options   The following portions of the chart were reviewed this encounter and updated as appropriate:  Tobacco  Allergies  Meds  Problems  Med Hx  Surg Hx  Fam Hx     Review of Systems:  No other skin or systemic complaints except as noted in HPI or Assessment and Plan.  Objective  Well appearing patient in no apparent distress; mood and affect are within normal limits.  A focused examination was performed including legs, arms . Relevant physical exam findings are noted in the Assessment and Plan.  Objective  legs and arms: Annular pink flat papules  Objective  Right upper arm x 3: Erythematous keratotic or waxy stuck-on papule or plaque.    Assessment & Plan  DSAP (disseminated superficial actinic porokeratosis) legs and arms  Biopsy results discussed and treatment options   Benign condition   Start Cholesterol: 2% Lovastatin: 2% Vehicle: Ointment [facility]  Email address: [email or web address]  Inflamed seborrheic keratosis Right upper arm x 3  Destruction of lesion - Right upper arm x 3 Complexity: simple   Destruction method: cryotherapy   Informed consent: discussed and consent obtained   Timeout:  patient name, date of birth, surgical site, and procedure verified Lesion destroyed using liquid nitrogen: Yes   Region frozen until ice ball extended beyond lesion: Yes   Outcome: patient tolerated procedure well with no complications   Post-procedure details: wound care instructions given    Return in about 3 months (around 03/01/2020).  I24/08/2019, CMA, am acting as scribe for Marye Round, MD .  Documentation: I have reviewed the above documentation for accuracy and completeness, and I agree with the above.  Sarina Ser, MD

## 2019-11-30 NOTE — Patient Instructions (Signed)
Instructions for [facility] Medications  One or more of your medications was sent to the [facility] mail order compounding pharmacy. You will receive an email from them and can purchase the medicine through that link. It will then be mailed to your home at the address you confirmed. If for any reason you do not receive an email from them, please check your spam folder. If you still do not find the email, please let us know.    

## 2019-12-02 ENCOUNTER — Encounter: Payer: Self-pay | Admitting: Dermatology

## 2020-02-16 ENCOUNTER — Other Ambulatory Visit: Payer: Self-pay

## 2020-02-16 ENCOUNTER — Encounter: Payer: Self-pay | Admitting: Physician Assistant

## 2020-02-16 ENCOUNTER — Other Ambulatory Visit: Payer: Self-pay | Admitting: Physician Assistant

## 2020-02-16 ENCOUNTER — Ambulatory Visit: Payer: Self-pay | Admitting: Physician Assistant

## 2020-02-16 VITALS — BP 123/70 | HR 94 | Temp 97.7°F | Resp 12

## 2020-02-16 DIAGNOSIS — B351 Tinea unguium: Secondary | ICD-10-CM

## 2020-02-16 MED ORDER — CICLOPIROX 8 % EX SOLN: Freq: Every day | CUTANEOUS | 3 refills | Status: DC

## 2020-02-16 NOTE — Progress Notes (Signed)
   Subjective: Toenail fungus    Patient ID: [name], female    DOB: 1970-09-05, 48 y.o.   MRN: [id number]  HPI Patient presents with 2 weeks of increasing discomfort to the toenails, discoloration, and elevation nails.  Patient originally thought she had a bacterial infection from pedicare. Review of Systems     Objective:   Physical Exam No acute distress.  Physical exam of the nails reveals mild yellowish discoloration and increased thickening.       Assessment & Plan:omychomycosis  Patient given discharge care instruction a prescription for Ciclopirox.  Follow-up in 2 months.

## 2020-02-16 NOTE — Progress Notes (Signed)
Toes itchy & hurting.  Has soaked in tea tree oil bath salts.  Ongoing for the past 1-2 weeks.  AMD

## 2020-02-29 ENCOUNTER — Ambulatory Visit: Payer: 59 | Admitting: Dermatology

## 2020-05-07 ENCOUNTER — Other Ambulatory Visit (HOSPITAL_COMMUNITY): Payer: Self-pay | Admitting: Surgery

## 2020-05-07 ENCOUNTER — Other Ambulatory Visit: Payer: Self-pay | Admitting: Surgery

## 2020-05-10 ENCOUNTER — Other Ambulatory Visit: Payer: Self-pay

## 2020-05-10 ENCOUNTER — Ambulatory Visit
Admission: RE | Admit: 2020-05-10 | Discharge: 2020-05-10 | Disposition: A | Discharge status: Home / Self Care | Payer: 59 | Source: Ambulatory Visit | Attending: Surgery | Admitting: Surgery

## 2020-05-10 ENCOUNTER — Encounter (INDEPENDENT_AMBULATORY_CARE_PROVIDER_SITE_OTHER): Payer: Self-pay

## 2020-05-10 DIAGNOSIS — K219 Gastro-esophageal reflux disease without esophagitis: Secondary | ICD-10-CM | POA: Diagnosis not present

## 2020-05-10 DIAGNOSIS — Z01818 Encounter for other preprocedural examination: Secondary | ICD-10-CM | POA: Diagnosis not present

## 2020-05-14 ENCOUNTER — Other Ambulatory Visit: Payer: Self-pay | Admitting: Family Medicine

## 2020-05-14 ENCOUNTER — Other Ambulatory Visit: Payer: Self-pay

## 2020-05-14 DIAGNOSIS — Z1152 Encounter for screening for COVID-19: Secondary | ICD-10-CM

## 2020-05-14 NOTE — Progress Notes (Signed)
Presents to [place] clinic for outdoor specimen collection for covid test by R. Hasmann, RN.  AMD

## 2020-05-16 ENCOUNTER — Telehealth: Payer: Self-pay

## 2020-05-16 LAB — SARS-COV-2, NAA 2 DAY TAT

## 2020-05-16 LAB — NOVEL CORONAVIRUS, NAA: SARS-CoV-2, NAA: DETECTED — AB

## 2020-05-16 NOTE — Telephone Encounter (Signed)
Pt called for Covid test results and requested nausea medication stating that its a struggle to keep water down. CL,RMA

## 2020-05-17 ENCOUNTER — Telehealth: Payer: Self-pay | Admitting: Infectious Diseases

## 2020-05-17 ENCOUNTER — Telehealth: Payer: Self-pay

## 2020-05-17 ENCOUNTER — Other Ambulatory Visit: Payer: Self-pay | Admitting: Infectious Diseases

## 2020-05-17 DIAGNOSIS — U071 COVID-19: Secondary | ICD-10-CM

## 2020-05-17 NOTE — Telephone Encounter (Signed)
Started feeling ill on Sunday AM with fever, nausea and stomach symptoms. Having diarrhea and cramping.   Was having a hard time with keeping fluids down but that is better today. Her breathing feels a little heavy. No coughing necessarily but has been using nebulizer.  Qualified with BMI > 35, previous tobacco user, unvaccinated.   Will set up for sotrovimab on Saturday.

## 2020-05-17 NOTE — Progress Notes (Signed)
I connected by phone with [name] on 05/17/2020 at 1:41 PM to discuss the potential use of a new treatment for mild to moderate COVID-19 viral infection in non-hospitalized patients.  This patient is a 50 y.o. female that meets the FDA criteria for Emergency Use Authorization of COVID monoclonal antibody sotrovimab.  Has a (+) direct SARS-CoV-2 viral test result  Has mild or moderate COVID-19   Is NOT hospitalized due to COVID-19  Is within 10 days of symptom onset  Has at least one of the high risk factor(s) for progression to severe COVID-19 and/or hospitalization as defined in EUA.  Specific high risk criteria : BMI > 25 and Other high risk medical condition per CDC:  tobacco user, unvaccinated   I have spoken and communicated the following to the patient or parent/caregiver regarding COVID monoclonal antibody treatment:  1. FDA has authorized the emergency use for the treatment of mild to moderate COVID-19 in adults and pediatric patients with positive results of direct SARS-CoV-2 viral testing who are 85 years of age and older weighing at least 40 kg, and who are at high risk for progressing to severe COVID-19 and/or hospitalization.  2. The significant known and potential risks and benefits of COVID monoclonal antibody, and the extent to which such potential risks and benefits are unknown.  3. Information on available alternative treatments and the risks and benefits of those alternatives, including clinical trials.  4. Patients treated with COVID monoclonal antibody should continue to self-isolate and use infection control measures (e.g., wear mask, isolate, social distance, avoid sharing personal items, clean and disinfect "high touch" surfaces, and frequent handwashing) according to CDC guidelines.   5. The patient or parent/caregiver has the option to accept or refuse COVID monoclonal antibody treatment.  After reviewing this information with the patient, the patient  has agreed to receive one of the available covid 19 monoclonal antibodies and will be provided an appropriate fact sheet prior to infusion. Laura Madeira, NP 05/17/2020 1:41 PM

## 2020-05-17 NOTE — Telephone Encounter (Signed)
Called to discuss with patient about COVID-19 symptoms and the use of one of the available treatments for those with mild to moderate Covid symptoms and at a high risk of hospitalization.  Pt appears to qualify for outpatient treatment due to co-morbid conditions and/or a member of an at-risk group in accordance with the FDA Emergency Use Authorization.    Symptom onset: 05/12/20 Vaccinated: No Booster? No Immunocompromised? No Qualifiers: BMI >25  Unable to reach pt - Reached pt.  [name]

## 2020-05-18 ENCOUNTER — Ambulatory Visit (HOSPITAL_COMMUNITY): Payer: 59

## 2020-05-20 ENCOUNTER — Ambulatory Visit: Payer: 59

## 2020-05-22 DIAGNOSIS — R000 Tachycardia, unspecified: Secondary | ICD-10-CM | POA: Diagnosis not present

## 2020-05-22 DIAGNOSIS — Z87891 Personal history of nicotine dependence: Secondary | ICD-10-CM | POA: Diagnosis not present

## 2020-05-22 DIAGNOSIS — R0602 Shortness of breath: Secondary | ICD-10-CM | POA: Diagnosis not present

## 2020-05-22 DIAGNOSIS — R059 Cough, unspecified: Secondary | ICD-10-CM | POA: Diagnosis not present

## 2020-05-22 DIAGNOSIS — R0789 Other chest pain: Secondary | ICD-10-CM | POA: Diagnosis not present

## 2020-05-22 DIAGNOSIS — Z79899 Other long term (current) drug therapy: Secondary | ICD-10-CM | POA: Diagnosis not present

## 2020-05-22 DIAGNOSIS — Z6839 Body mass index (BMI) 39.0-39.9, adult: Secondary | ICD-10-CM | POA: Diagnosis not present

## 2020-05-22 DIAGNOSIS — K219 Gastro-esophageal reflux disease without esophagitis: Secondary | ICD-10-CM | POA: Diagnosis not present

## 2020-05-22 DIAGNOSIS — R112 Nausea with vomiting, unspecified: Secondary | ICD-10-CM | POA: Diagnosis not present

## 2020-05-22 DIAGNOSIS — R918 Other nonspecific abnormal finding of lung field: Secondary | ICD-10-CM | POA: Diagnosis not present

## 2020-05-22 DIAGNOSIS — U071 COVID-19: Secondary | ICD-10-CM | POA: Diagnosis not present

## 2020-05-28 ENCOUNTER — Ambulatory Visit: Payer: Self-pay | Admitting: Adult Medicine

## 2020-05-28 DIAGNOSIS — R0602 Shortness of breath: Secondary | ICD-10-CM | POA: Diagnosis not present

## 2020-05-28 DIAGNOSIS — Z9049 Acquired absence of other specified parts of digestive tract: Secondary | ICD-10-CM | POA: Diagnosis not present

## 2020-05-28 DIAGNOSIS — R5381 Other malaise: Secondary | ICD-10-CM | POA: Diagnosis not present

## 2020-05-28 DIAGNOSIS — R0603 Acute respiratory distress: Secondary | ICD-10-CM | POA: Diagnosis not present

## 2020-05-28 DIAGNOSIS — R000 Tachycardia, unspecified: Secondary | ICD-10-CM | POA: Diagnosis not present

## 2020-05-28 DIAGNOSIS — R918 Other nonspecific abnormal finding of lung field: Secondary | ICD-10-CM | POA: Diagnosis not present

## 2020-05-28 DIAGNOSIS — Z90710 Acquired absence of both cervix and uterus: Secondary | ICD-10-CM | POA: Diagnosis not present

## 2020-05-28 DIAGNOSIS — E669 Obesity, unspecified: Secondary | ICD-10-CM | POA: Diagnosis not present

## 2020-05-28 DIAGNOSIS — R059 Cough, unspecified: Secondary | ICD-10-CM

## 2020-05-28 DIAGNOSIS — Z20822 Contact with and (suspected) exposure to covid-19: Secondary | ICD-10-CM | POA: Diagnosis not present

## 2020-05-28 DIAGNOSIS — R0600 Dyspnea, unspecified: Secondary | ICD-10-CM

## 2020-05-28 DIAGNOSIS — Z87891 Personal history of nicotine dependence: Secondary | ICD-10-CM | POA: Diagnosis not present

## 2020-05-28 DIAGNOSIS — U071 COVID-19: Secondary | ICD-10-CM | POA: Diagnosis not present

## 2020-05-28 DIAGNOSIS — R590 Localized enlarged lymph nodes: Secondary | ICD-10-CM

## 2020-05-28 DIAGNOSIS — R0689 Other abnormalities of breathing: Secondary | ICD-10-CM

## 2020-05-28 DIAGNOSIS — R5383 Other fatigue: Secondary | ICD-10-CM | POA: Diagnosis not present

## 2020-05-28 DIAGNOSIS — K219 Gastro-esophageal reflux disease without esophagitis: Secondary | ICD-10-CM | POA: Diagnosis not present

## 2020-05-29 DIAGNOSIS — Z20822 Contact with and (suspected) exposure to covid-19: Secondary | ICD-10-CM | POA: Diagnosis not present

## 2020-05-29 DIAGNOSIS — R918 Other nonspecific abnormal finding of lung field: Secondary | ICD-10-CM | POA: Diagnosis not present

## 2020-05-29 DIAGNOSIS — R0602 Shortness of breath: Secondary | ICD-10-CM | POA: Diagnosis not present

## 2020-05-29 NOTE — Progress Notes (Signed)
   Subjective:    Patient ID: [name], female    DOB: 11-Feb-1971, 50 y.o.   MRN: [id number]  HPI  18 y with difficulty breathing seen at [facility] ER ~1/26 discharge home for supportive care ~. Covid + results ~1/22/22Client reports cough worsen assoc With gagging retching. Is seen as unscheduled emergency today  Review of Systems  Constitutional: Negative for chills, diaphoresis and fever.  HENT: Positive for congestion. Negative for sinus pressure.   Eyes: Negative.   Respiratory: Positive for apnea, cough, choking, chest tightness, shortness of breath and stridor.   Cardiovascular: Negative for chest pain and palpitations.  Endocrine: Negative.   Genitourinary: Negative.   Skin: Positive for pallor.  Neurological: Negative.   Psychiatric/Behavioral: Positive for agitation.       Objective:   Physical Exam Constitutional:      General: She is in acute distress.     Appearance: She is obese. She is ill-appearing.  HENT:     Head: Normocephalic and atraumatic.     Right Ear: Tympanic membrane normal.     Left Ear: Tympanic membrane normal.     Nose: Congestion present.     Mouth/Throat:     Pharynx: Posterior oropharyngeal erythema present.  Eyes:     Extraocular Movements: Extraocular movements intact.     Conjunctiva/sclera: Conjunctivae normal.     Pupils: Pupils are equal, round, and reactive to light.  Cardiovascular:     Rate and Rhythm: Regular rhythm. Tachycardia present.     Heart sounds: Normal heart sounds.  Pulmonary:     Effort: Respiratory distress present.     Breath sounds: Stridor present. No wheezing or rhonchi.  Chest:     Chest wall: Tenderness present.  Abdominal:     General: Bowel sounds are normal. There is distension.     Tenderness: There is no abdominal tenderness.  Musculoskeletal:        General: Normal range of motion.     Cervical back: Neck supple.  Lymphadenopathy:     Cervical: Cervical adenopathy present.  Skin:     Coloration: Skin is not jaundiced.     Findings: No rash.  Neurological:     General: No focal deficit present.            Assessment & Plan:  Client is in acute respiratory distress RR26-28 Violent cough with retching Epi [place] .3 cc gn, Allupent inhalation instituted RR18 but cough persists with hyperresponsive gag reflex, retching and vomiting [facility] ER MD on duty contacted and hx verbally discussed. Client care transferred will need solumedrol iv, Inhalation f/u,

## 2020-06-03 ENCOUNTER — Ambulatory Visit: Payer: Self-pay | Admitting: Adult Medicine

## 2020-06-03 VITALS — BP 116/80 | HR 123 | Temp 97.6°F | Resp 16 | Ht 68.0 in | Wt 255.0 lb

## 2020-06-03 DIAGNOSIS — R059 Cough, unspecified: Secondary | ICD-10-CM

## 2020-06-03 DIAGNOSIS — R0689 Other abnormalities of breathing: Secondary | ICD-10-CM

## 2020-06-03 MED ORDER — MENTHOL-CAMPHOR 5.1-5.1 % EX OINT: 1.0000 | TOPICAL_OINTMENT | CUTANEOUS | 1 refills | Status: AC | PRN

## 2020-06-03 NOTE — Progress Notes (Signed)
79yr s/p covid with high Bmi returns with deep Gagging cough, which can cause retching She has rec'd  Inhalation tx at last visit which assisted But cough persist refractory to mucinex, tessalon pearls  [facility] result showed ground glass pulm shadow Pneumonitis consistent wiith prior covid infection  Focus exam  116/80 Chest RR 18 Mild cervical retraction, active cough Pulm bronchial breath sound   no rhonchi A to E change Cough was majorly interrupted with ant/post application Of  menthol     Impression Bronchiolitis,  Continue robitussin with codeine Application of menthol to chest reduce cough  Continue vitc vitd zn intake F/u prn as needed

## 2020-06-04 ENCOUNTER — Ambulatory Visit (INDEPENDENT_AMBULATORY_CARE_PROVIDER_SITE_OTHER): Payer: 59 | Admitting: Psychology

## 2020-06-04 ENCOUNTER — Telehealth: Payer: Self-pay | Admitting: Family

## 2020-06-04 NOTE — Telephone Encounter (Signed)
Spoke with [place]. She was appreciative of call back and understanding of recommendations. Provided number for post COVID care clinie.   Loel Dubonnet, NP

## 2020-06-04 NOTE — Telephone Encounter (Signed)
Received call from [place] [phone]) with questions regarding MAB for individuals with long hauler COVID19 symptoms.   Brief HPI: [name] is a 50 yo female with hx of obesity. She was diagnosed with COVID 19 with symptom onset 05/12/20 and set up for Sotrovimab infusion 05/18/20, but cancelled her appointment. Seen in the [facility] ED 05/22/20 due to cough and shortness of breath. CXR with mild bibasilr reticular opacities. Seen by her primary care provider 05/28/20 and sent to ED due to violent cough with retching. CTA chest with no acute pulmonary embolism, multifocal bilateral lung opacities compatible with history of known viral pneumonia. She was given Rx for Tessalon and codeine-guafenesin for cough and discharged home. Seen by Dr. Noreene Larsson at [facility] yesterday. Diagnosed with bronchiolitis and encouraged to continue robitussin with codeine and follow up as-needed.  The EUA for Sotrovimab is for within 10 days of symptom onset of [name]. Present EUA does not include approval for long-hauler symptoms. Per [facility] present guidelines we are administering MAB to those appropriate for treatment within 7 days of symptom onset.   Left Vm for Amy with callback number and description of our utilization for MAB. Provided post-COVID care clinic phone number though appears patient has been referred to post-covid care clinic at [facility].   Loel Dubonnet, NP

## 2020-06-19 ENCOUNTER — Ambulatory Visit (INDEPENDENT_AMBULATORY_CARE_PROVIDER_SITE_OTHER): Payer: 59 | Admitting: Psychology

## 2020-06-19 ENCOUNTER — Ambulatory Visit: Payer: 59 | Admitting: Dietician

## 2020-06-19 ENCOUNTER — Ambulatory Visit: Payer: 59 | Admitting: Skilled Nursing Facility1

## 2020-06-19 DIAGNOSIS — R69 Illness, unspecified: Secondary | ICD-10-CM | POA: Diagnosis not present

## 2020-06-19 DIAGNOSIS — F509 Eating disorder, unspecified: Secondary | ICD-10-CM

## 2020-06-21 ENCOUNTER — Ambulatory Visit: Payer: 59 | Admitting: Dietician

## 2020-07-01 ENCOUNTER — Encounter: Payer: 59 | Attending: Surgery | Admitting: Dietician

## 2020-07-01 ENCOUNTER — Other Ambulatory Visit: Payer: Self-pay

## 2020-07-01 ENCOUNTER — Encounter: Payer: Self-pay | Admitting: Dietician

## 2020-07-01 VITALS — Ht 67.0 in | Wt 254.9 lb

## 2020-07-01 DIAGNOSIS — K219 Gastro-esophageal reflux disease without esophagitis: Secondary | ICD-10-CM | POA: Diagnosis not present

## 2020-07-01 DIAGNOSIS — Z6839 Body mass index (BMI) 39.0-39.9, adult: Secondary | ICD-10-CM | POA: Insufficient documentation

## 2020-07-01 DIAGNOSIS — E669 Obesity, unspecified: Secondary | ICD-10-CM

## 2020-07-01 NOTE — Patient Instructions (Signed)
   Practice chewing foods thoroughly. Work on taking small bites and chewing about 30 times each bite.   Investigate options for protein drinks/ shakes.   Great job making healthy food choices and lifestyle changes! Keep up the awesome work!

## 2020-07-01 NOTE — Progress Notes (Signed)
Nutrition Assessment for Bariatric Surgery   Appointment start time: 1450   end time: 1550  Planned Surgery: Sleeve Gastrectomy  Anthropometrics: Weight: 254.9lbs Height: 5'7" BMI: 39.92   Patient's Goal Weight: 170lbs  Clinical: Medical History: GERD, hip and back pain, diverticulosis, sleep apnea Medications and Supplements: albuterol, pantoprazole Relevant labs:  Notable symptoms: patient reports worsening GERD symptoms as well as back, hip and ankle pain with weight gain Drug allergies: none known Food allergies: none known  Lifestyle and Dietary History:  Dieting/ weight history:   patient reports weight gain since having galbladder removal 5 years ago, was about 200lbs prior to that surgery.   She has tried exercised for weight loss, lost about 2lbs over 6 months when exercising 4-5 times a week.   She reports also trying semi-liquid diet with only short-term success.    Disordered or emotional eating history: no eating disorders; denies any emotional eating.  Physical activity: walking 30-60 minutes 3-5 times per week outdoors or on treadmill  Dietary Recall:  Daily pattern: 3 meals and 0-1 snacks. Dining out: 3-5 meals per week. Breakfast: 8:30-9:30 eggs with grits; yogurt; cottage cheese; occ protein shake but does not stay satisfied (recently stopped bread and potatoes) Snack: none Lunch: grilled chicken/ seafood; salad; lettuce wrap with cold cuts; pasta salad with veggies Snack: occasional popcorn; granola bar Supper: whole grain or veggie pasta + [place] Snack: same as pm or none Beverages: water, coffee in am or hot tea   Nutrition Intervention:  Instructed patient on pre-op diet goals and importance of close adherence to bariatric diet after surgery to avoid side effects and complications.   Discussed stages of the bariatric diet after surgery as well as the importance of adequate protein and fluid intake.   Discussed permanent lifestyle changes as  important for long-term success.  Provided overview of 2-week pre-op diet.   Nutrition Diagnosis: [place]-3.3 Overweight/ obesity related to history of excess calories and inadequate physical activity as evidenced by patient with current BMI of 39.92, following dietary guidelines for weight loss prior to bariatric surgery.  Teaching method(s) utilized: Copy provided: . Pre-op Goals . Diet Stage Template . Visit summary with goals/ instructions  Learning Readiness: Change in progress  Barriers to learning/ implementing lifestyle change: none  Demonstrated degree of understanding via: Teach Back  Summary:  Patient has begun making diet and lifestyle changes in effort to lose weight and prepare for bariatric surgery. She has purchased containers and dishes to help with portion control, and has involved immediate family in making healthy changes.  Patient's goal for weight loss is <200lbs, ideally 165-175lbs.  She has solid support from family and coworkers; mother as well as 3 coworkers have had bariatric surgery.   She agrees to work on increasing chewing of foods, taking small bites prior to surgery, for better diet transition and improved GERD symptoms.   She is motivated to follow the bariatric diet after surgery. From a nutrition standpoint, she is ready to proceed with the bariatric surgery program.    Plan:  Patient commits to returning for supervised weight loss visits as well as pre-op class prior to surgery.   She will plan to return for post-op RD visits beginning 2 weeks after surgery.

## 2020-07-12 ENCOUNTER — Ambulatory Visit: Payer: Self-pay

## 2020-07-12 ENCOUNTER — Other Ambulatory Visit: Payer: Self-pay

## 2020-07-12 ENCOUNTER — Encounter: Payer: Self-pay | Admitting: Dietician

## 2020-07-12 ENCOUNTER — Encounter: Payer: 59 | Admitting: Dietician

## 2020-07-12 VITALS — Ht 67.0 in | Wt 254.0 lb

## 2020-07-12 DIAGNOSIS — Z6839 Body mass index (BMI) 39.0-39.9, adult: Secondary | ICD-10-CM

## 2020-07-12 DIAGNOSIS — E669 Obesity, unspecified: Secondary | ICD-10-CM

## 2020-07-12 DIAGNOSIS — K219 Gastro-esophageal reflux disease without esophagitis: Secondary | ICD-10-CM | POA: Diagnosis not present

## 2020-07-12 NOTE — Progress Notes (Signed)
Supervised Weight Loss for Bariatric Surgery Appt start time: 0915 end time:  0935.  SWL Appointment 2 of 12   Anthropometrics Start Weight at [facility]: 254.9lbs  (07/01/20) Weight: 254lbs (patient reported) Height: 5'7"  BMI: 39.78  Clinical Medications: no changes Health/ medical history changes: none  Dietary/ Lifestyle Progress: . 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 Her average step count has been about 7000, which increased to 12,000 with the new job. . She continues to limit carbohydrate sources with meals and snacks and is including a variety of protein sources along with veg and fruits, and only small portions of grains/ starches. . Daughter's wedding is in 2 days.  . Patient has been having worse GERD symptoms recently, feels burning at back of throat almost constantly.   Dietary intake: Eating Pattern: 81meals and 0-1snacks daily Dining out:   Breakfast: eggs and grits; yogurt/ cottage cheese with fruit and granola  Snack: none  Lunch: grilled chicken/ seafood + vegetables; canned chicken on lettuce wrap Snack: 2-3 peanut butter crackers Dinner: lean protein ie [place] + veg + whole grain or veggie pasta Snack: none or same as pm Beverages:   Usual physical activity: walking 30-60 minutes, 3-5 times per week + active part-time job (cleaning)    Intervention:   . Nutrition counseling for weight loss prior to upcoming bariatric surgery.               Nutritional Diagnosis:  [place]-3.3 Overweight/obesity related to history of excess calories and inadequate physical activity as evidenced by patient with current BMI of 39.78, following dietary guidelines for weight loss prior to bariatric surgery.               Teaching Method Utilized:  Auditory   Learning Readiness:   Change in progress  Barriers to learning/adherence to lifestyle change: none  Demonstrated degree of understanding via:  Teach Back   Plan:    continue with current eating pattern and activity.  Return for next visit on 07/15/20

## 2020-07-12 NOTE — Progress Notes (Signed)
Pt presents today to complete physical labs along with other labs requested from another physician. Pt scheduled to complete physical with rhonda summers,PA-C 06/19/20. CL,RMA

## 2020-07-12 NOTE — Patient Instructions (Signed)
Continue with current eating pattern and regular physical activity °

## 2020-07-13 LAB — CMP12+LP+TP+TSH+6AC+CBC/D/PLT
ALT: 28 IU/L (ref 0–32)
AST: 63 IU/L — ABNORMAL HIGH (ref 0–40)
Albumin/Globulin Ratio: 1.9 (ref 1.2–2.2)
Albumin: 4.7 g/dL (ref 3.8–4.8)
Alkaline Phosphatase: 84 IU/L (ref 44–121)
BUN/Creatinine Ratio: 16 (ref 9–23)
BUN: 10 mg/dL (ref 6–24)
Basophils Absolute: 0.1 10*3/uL (ref 0.0–0.2)
Basos: 1 %
Bilirubin Total: 0.6 mg/dL (ref 0.0–1.2)
Calcium: 9.3 mg/dL (ref 8.7–10.2)
Chloride: 102 mmol/L (ref 96–106)
Chol/HDL Ratio: 3.7 ratio (ref 0.0–4.4)
Cholesterol, Total: 196 mg/dL (ref 100–199)
Creatinine, Ser: 0.61 mg/dL (ref 0.57–1.00)
EOS (ABSOLUTE): 0.1 10*3/uL (ref 0.0–0.4)
Eos: 2 %
Estimated CHD Risk: 0.7 times avg. (ref 0.0–1.0)
Free Thyroxine Index: 2 (ref 1.2–4.9)
GGT: 78 IU/L — ABNORMAL HIGH (ref 0–60)
Globulin, Total: 2.5 g/dL (ref 1.5–4.5)
Glucose: 103 mg/dL — ABNORMAL HIGH (ref 65–99)
HDL: 53 mg/dL (ref >39)
Hematocrit: 44.4 % (ref 34.0–46.6)
Hemoglobin: 14.3 g/dL (ref 11.1–15.9)
Immature Grans (Abs): 0 10*3/uL (ref 0.0–0.1)
Immature Granulocytes: 0 %
Iron: 75 ug/dL (ref 27–159)
LDH: 196 IU/L (ref 119–226)
LDL Chol Calc (NIH): 128 mg/dL — ABNORMAL HIGH (ref 0–99)
Lymphocytes Absolute: 2 10*3/uL (ref 0.7–3.1)
Lymphs: 31 %
MCH: 28.4 pg (ref 26.6–33.0)
MCHC: 32.2 g/dL (ref 31.5–35.7)
MCV: 88 fL (ref 79–97)
Monocytes Absolute: 0.6 10*3/uL (ref 0.1–0.9)
Monocytes: 9 %
Neutrophils Absolute: 3.7 10*3/uL (ref 1.4–7.0)
Neutrophils: 57 %
Phosphorus: 3.3 mg/dL (ref 3.0–4.3)
Platelets: 259 10*3/uL (ref 150–450)
Potassium: 4.6 mmol/L (ref 3.5–5.2)
RBC: 5.03 x10E6/uL (ref 3.77–5.28)
RDW: 12.4 % (ref 11.7–15.4)
Sodium: 139 mmol/L (ref 134–144)
T3 Uptake Ratio: 24 % (ref 24–39)
T4, Total: 8.2 ug/dL (ref 4.5–12.0)
TSH: 1.26 u[IU]/mL (ref 0.450–4.500)
Total Protein: 7.2 g/dL (ref 6.0–8.5)
Triglycerides: 85 mg/dL (ref 0–149)
Uric Acid: 6.4 mg/dL — ABNORMAL HIGH (ref 2.6–6.2)
VLDL Cholesterol Cal: 15 mg/dL (ref 5–40)
WBC: 6.4 10*3/uL (ref 3.4–10.8)
eGFR: 110 mL/min/{1.73_m2} (ref >59)

## 2020-07-13 LAB — HGB A1C W/O EAG: Hgb A1c MFr Bld: 5.5 % (ref 4.8–5.6)

## 2020-07-13 LAB — VITAMIN B12: Vitamin B-12: 1110 pg/mL (ref 232–1245)

## 2020-07-13 LAB — VITAMIN B1

## 2020-07-13 LAB — CALCITRIOL (1,25 DI-OH VIT D): Vit D, 1,25-Dihydroxy: 41.2 pg/mL (ref 19.9–79.3)

## 2020-07-15 ENCOUNTER — Encounter: Payer: Self-pay | Admitting: Dietician

## 2020-07-15 ENCOUNTER — Other Ambulatory Visit: Payer: Self-pay

## 2020-07-15 ENCOUNTER — Ambulatory Visit: Payer: 59 | Admitting: Dietician

## 2020-07-15 ENCOUNTER — Encounter: Payer: 59 | Admitting: Dietician

## 2020-07-15 VITALS — Ht 67.0 in | Wt 254.0 lb

## 2020-07-15 DIAGNOSIS — Z6839 Body mass index (BMI) 39.0-39.9, adult: Secondary | ICD-10-CM | POA: Diagnosis not present

## 2020-07-15 DIAGNOSIS — K219 Gastro-esophageal reflux disease without esophagitis: Secondary | ICD-10-CM | POA: Diagnosis not present

## 2020-07-15 DIAGNOSIS — E669 Obesity, unspecified: Secondary | ICD-10-CM

## 2020-07-15 NOTE — Progress Notes (Signed)
Supervised Weight Loss for Bariatric Surgery Appt start time: 1420 end time:  1435.  SWL Appointment 3 of 12   Anthropometrics Start Weight at N[facility]: 254.9lbs  (07/01/20) Weight: 254lbs (pt reported) Height: 5'7'  BMI: 39.78  Clinical Medications: no changes Health/ medical history changes: no changes  Dietary/ Lifestyle Progress: . Patient reports avoiding cake at daughter's wedding yesterday, she ate grilled chicken and other healthy options; logged over 15,000 steps.  . Her goal is over 10000 steps daily. . She has plans for pre-prepping meals to help manage diet now as well as after surgery.   Dietary intake: Eating Pattern: 3 meals and 0 snacks daily Dining out:   Breakfast: eggs and grits; yogurt or cottage cheese with fruit and granola  Snack: none  Lunch: grilled chicken/ seafood + veg Snack: none Dinner: mostly poultry/ seafood + veg + whole grain or veggie pasta Snack: none  Beverages: water, coffee or hot tea in am  Usual physical activity: walking 30-60 minutes, 3-5 times per week + on-the-job activity    Intervention:   . Nutrition counseling for weight loss prior to upcoming bariatric surgery. . Discussed dealing with special occasions after surgery . Discussed benefits of making concrete plans/ goals for change.                Nutritional Diagnosis:  [place]-3.3 Overweight/obesity related to history of excess calories and inadequate physical activity as evidenced by patient with current BMI of 39.78, following dietary guidelines for weight loss prior to bariatric surgery.               Teaching Method Utilized:  Auditory   Learning Readiness:   Change in progress  Barriers to learning/adherence to lifestyle change: none  Demonstrated degree of understanding via:  Teach Back   Plan:  Follow up 07/22/20.

## 2020-07-15 NOTE — Patient Instructions (Signed)
   Continue with current eating pattern and regular exercise.  Begin meal prepping with son.

## 2020-07-16 ENCOUNTER — Other Ambulatory Visit: Payer: Self-pay

## 2020-07-16 NOTE — Progress Notes (Signed)
Scheduled to complete physical with Letitia Neri, PA-C.  AMD

## 2020-07-17 ENCOUNTER — Telehealth: Payer: 59 | Admitting: Dietician

## 2020-07-17 ENCOUNTER — Encounter: Payer: Self-pay | Admitting: Emergency Medicine

## 2020-07-17 ENCOUNTER — Ambulatory Visit: Payer: Self-pay | Admitting: Emergency Medicine

## 2020-07-17 ENCOUNTER — Ambulatory Visit: Payer: 59 | Admitting: Psychology

## 2020-07-17 ENCOUNTER — Other Ambulatory Visit: Payer: Self-pay

## 2020-07-17 VITALS — Ht 68.0 in | Wt 254.0 lb

## 2020-07-17 DIAGNOSIS — Z0000 Encounter for general adult medical examination without abnormal findings: Secondary | ICD-10-CM

## 2020-07-17 MED ORDER — PANTOPRAZOLE SODIUM 40 MG PO TBEC: 40.0000 mg | DELAYED_RELEASE_TABLET | Freq: Every day | ORAL | 3 refills | Status: DC

## 2020-07-17 NOTE — Progress Notes (Signed)
Pt needs refill on pantoprazole 40mg  tablet.

## 2020-07-17 NOTE — Progress Notes (Signed)
I have reviewed the triage vital signs and the nursing notes.   HISTORY  Chief Complaint Annual Exam   HPI [name] is a 49 y.o. female present for annual physical.        Past Medical History:  Diagnosis Date  . Anxiety   . Depression   . Elevated lipids   . GERD (gastroesophageal reflux disease)   . Obesity     Patient Active Problem List   Diagnosis Date Noted  . Primary osteoarthritis involving multiple joints 08/21/2019  . Obesity (BMI 30-39.9) 08/02/2019  . Morbid obesity ([place]) 03/06/2019  . Pain in joint, shoulder region 12/20/2018  . Esophageal dysphagia 02/15/2017  . S/P laparoscopic cholecystectomy 09/11/2015  . Hyperplastic colon polyp 08/30/2015    Past Surgical History:  Procedure Laterality Date  . ABDOMINAL HYSTERECTOMY    . APPENDECTOMY    . CHOLECYSTECTOMY    . colonoscopy  2018 or 2019  . LAPAROSCOPIC OOPHERECTOMY    . UPPER GI ENDOSCOPY  2018 or 2019    Prior to Admission medications   Medication Sig Start Date End Date Taking? Authorizing Provider  albuterol (PROVENTIL) (2.5 MG/3ML) 0.083% nebulizer solution albuterol sulfate 2.5 mg/3 mL (0.083 %) solution for nebulization  VVN QID PRN   Yes [provider]  pantoprazole (PROTONIX) 40 MG tablet Take 1 tablet (40 mg total) by mouth daily. 03/06/19  Yes Sable Feil, PA-C    Allergies Patient has no active allergies.  Family History  Problem Relation Age of Onset  . Breast cancer Maternal Aunt   . Diabetes Mother   . Diabetes Maternal Grandfather   . Diabetes Maternal Grandmother   . Diabetes Paternal Grandmother     Social History Social History   Tobacco Use  . Smoking status: Former Smoker    Packs/day: 1.00    Types: Cigarettes    Quit date: 04/2018    Years since quitting: 2.2  . Smokeless tobacco: Never Used  Substance Use Topics  . Alcohol use: Yes    Alcohol/week: 2.0 - 4.0 standard drinks    Types: 2 - 4 Standard drinks or equivalent per week     Review of Systems Constitutional: No fever/chills Eyes: No visual changes. Cardiovascular: Denies chest pain. Respiratory: Denies shortness of breath. Gastrointestinal: No abdominal pain.  No nausea, no vomiting.  Genitourinary: Negative for dysuria. Musculoskeletal: Negative for musculoskeletal pain. Skin: Negative for rash. Neurological: Negative for headaches, focal weakness or numbness. ____________________________________________   PHYSICAL EXAM: Constitutional: Alert and oriented. Well appearing and in no acute distress. Eyes: Conjunctivae are normal. PERRL. EOMI. Head: Atraumatic. Nose: No congestion/rhinnorhea. Neck: No stridor.  Cardiovascular: Normal rate, regular rhythm. Grossly normal heart sounds.  Good peripheral circulation. Respiratory: Normal respiratory effort.  No retractions. Lungs CTAB. Gastrointestinal: Soft and nontender. No distention.  Bowel sounds are active x4 quadrants. Musculoskeletal: Moves upper and lower extremities without any difficulty normal gait was noted.  No edema noted lower extremities. Neurologic:  Normal speech and language. No gross focal neurologic deficits are appreciated. No gait instability. Skin:  Skin is warm, dry and intact. No rash noted. Psychiatric: Mood and affect are normal. Speech and behavior are normal.  ____________________________________________   LABS (all labs ordered are listed, but only abnormal results are displayed)  Reviewed ____________________________________________    FINAL CLINICAL IMPRESSION(S)   Protonix 40 mg was renewed with 3 refills.  Patient is return as needed.   ED Discharge Orders    None  Note:  This document was prepared using Dragon voice recognition software and may include unintentional dictation errors. 

## 2020-07-22 ENCOUNTER — Encounter: Payer: Self-pay | Admitting: Dietician

## 2020-07-22 ENCOUNTER — Ambulatory Visit: Payer: 59 | Admitting: Dietician

## 2020-07-22 VITALS — Ht 67.0 in | Wt 254.0 lb

## 2020-07-22 DIAGNOSIS — Z6839 Body mass index (BMI) 39.0-39.9, adult: Secondary | ICD-10-CM

## 2020-07-22 DIAGNOSIS — E669 Obesity, unspecified: Secondary | ICD-10-CM

## 2020-07-22 DIAGNOSIS — K219 Gastro-esophageal reflux disease without esophagitis: Secondary | ICD-10-CM | POA: Diagnosis not present

## 2020-07-22 NOTE — Progress Notes (Signed)
Supervised weight Loss for Bariatric Surgery Visit start time: 1510  end time: 1530   Anthropometrics:  Start weight at [facility]: 254.9lbs (07/01/20) Current weight: 254lbs Height: 5'7"  Clinical Medications, supplement changes: no changes Psychosocial issues/ stress concerns: none    Dietary/ Lifestyle Progress:  . Weight stable despite increased effort to reduce food portions and increase physical activity. . Patient has been reducing portion sizes. . She has purchased some meal replacement shakes and plans to use for one meal daily. She is keeping some healthy snack options on hand in case of hunger.  . She is starting a Museum/gallery conservator. . Other coworkers have joined in her effort to increase daily movement, and some are beginning to bring lunches from home to decrease frequency of restaurant meals.   Dietary Intake:  Usual eating pattern includes 3 meals and 0 snacks per day. Dining out frequency:   Breakfast: eggs and grits; yogurt and fruit and granola Snack:  none Lunch: slim fast shake + fruit Snack: only if hungry Supper: chicken/ seafood + low carb veg + small portion whole grain pasta/ starch Snack: none Beverages: water, coffee or hot tea  Usual physical activity: on-the-job activity + walking throughout the day with other coworkers.  Intervention:  . Reviewed progress since previous visit . Discussed ways bariatric surgery aids in weight loss ie GI hormones . Discussed use of meal replacement shakes and avoiding burnout    Nutritional Diagnosis:  [place]-3.3 Overweight/obesity As related to history of excess calories and inadequate physical activity.  As evidenced by patient with current BMI of 39.78, working on diet and lifestyle changes to promote weight loss.   Teaching method utilized: Auditory Visual  Learning Readiness: . change in progress  Barriers to learning/ adherence to lifestyle change: none  Demonstrated degree of understanding via:   Teach  back   Plan:  follow up 08/01/20

## 2020-07-22 NOTE — Patient Instructions (Signed)
   Start trying some meal replacement shakes.  Begin journaling food intake  Continue with healthy food choices and regular physical activity

## 2020-07-26 ENCOUNTER — Telehealth: Payer: Self-pay

## 2020-07-26 LAB — VITAMIN B1: Thiamine: 148.5 nmol/L (ref 66.5–200.0)

## 2020-07-26 NOTE — Telephone Encounter (Signed)
Patient called to inquire about blood work results. B1 results shared with patient per request.

## 2020-08-01 ENCOUNTER — Encounter: Payer: 59 | Attending: Surgery | Admitting: Dietician

## 2020-08-01 ENCOUNTER — Other Ambulatory Visit: Payer: Self-pay

## 2020-08-01 ENCOUNTER — Encounter: Payer: Self-pay | Admitting: Dietician

## 2020-08-01 VITALS — Ht 67.0 in | Wt 255.3 lb

## 2020-08-01 DIAGNOSIS — Z6841 Body Mass Index (BMI) 40.0 and over, adult: Secondary | ICD-10-CM | POA: Insufficient documentation

## 2020-08-01 DIAGNOSIS — Z6839 Body mass index (BMI) 39.0-39.9, adult: Secondary | ICD-10-CM | POA: Insufficient documentation

## 2020-08-01 DIAGNOSIS — E669 Obesity, unspecified: Secondary | ICD-10-CM | POA: Insufficient documentation

## 2020-08-01 NOTE — Patient Instructions (Signed)
Continue with current eating pattern, trying shake for evening meal for possible help with heartburn symptoms.

## 2020-08-01 NOTE — Progress Notes (Signed)
Supervised Weight Loss for Bariatric Surgery Appt start time: 1630 end time:  5374  SWL Appointment 4 of 12   Anthropometrics Start Weight at [facility]: 254.9lbs  (07/01/20) Weight: 355.3lbs Height: 5'7"  BMI: 39.99  Clinical Medications: no changes Health/ medical history changes: no changes  Dietary/ Lifestyle Progress: . Patient reports some increased hunger in afternoons since having meal replacement shake as her lunch. She has included some healthy snack choices. . She plans to switch meal replacement shake to dinner to help ease heartburn symptoms. Takes pantoprazole which helps somewhat.    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 Nutrition counseling for weight loss prior to upcoming bariatric surgery. . Discussed effects of increased physical activity on weight and body composition, and potential for decreasing body size more quickly than losing pounds.               Nutritional Diagnosis:  [place]-3.3 Overweight/obesity related to history of excess calories and inadequate physical activity as evidenced by patient with current BMI of 39.9, following dietary guidelines for weight loss prior to bariatric surgery.               Teaching Method Utilized:  Acupuncturist provided:  Visit summary with goals/ instructions  Learning Readiness:   Change in progress  Barriers to learning/adherence to lifestyle change: none  Demonstrated degree of  understanding via:  Teach Back   Plan:  08/07/20 at 4:30pm.

## 2020-08-07 ENCOUNTER — Other Ambulatory Visit: Payer: Self-pay

## 2020-08-07 ENCOUNTER — Encounter: Payer: Self-pay | Admitting: Dietician

## 2020-08-07 ENCOUNTER — Encounter: Payer: 59 | Admitting: Dietician

## 2020-08-07 VITALS — Ht 67.0 in | Wt 255.3 lb

## 2020-08-07 DIAGNOSIS — Z6839 Body mass index (BMI) 39.0-39.9, adult: Secondary | ICD-10-CM | POA: Diagnosis not present

## 2020-08-07 DIAGNOSIS — Z6841 Body Mass Index (BMI) 40.0 and over, adult: Secondary | ICD-10-CM | POA: Diagnosis not present

## 2020-08-07 DIAGNOSIS — E669 Obesity, unspecified: Secondary | ICD-10-CM | POA: Diagnosis not present

## 2020-08-07 NOTE — Progress Notes (Signed)
Supervised Weight Loss for Bariatric Surgery Appt start time: 1540 end time:  1610.  SWL Appointment 6 of 12   Anthropometrics Start Weight at NDES: 254.9lbs  (07/01/20) Weight: 255lbs Height: 5'7"  BMI: 39.9  Clinical Medications: albuterol, pantoprazole, vitamin B12, vitamin D Health/ medical history changes: no changes  Dietary/ Lifestyle Progress: . Weight remains stable despite calorie restriction and regular physical activity; this is causing frustration for patient. . She has begun taking a vinegar supplement in pill form; she is increasing dose slowly to avoid heartburn symptoms. Typical dose is 2 tabs twice daily. . She has ongoing hunger symptoms due to low calorie meals and protein shake as meal replacement.    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 Nutrition counseling for weight loss prior to upcoming bariatric surgery.               Nutritional Diagnosis:  [place]-3.3 Overweight/obesity related to history of excess calories and inadequate physical activity as evidenced by patient with current BMI of 39.9, following dietary guidelines for weight loss prior to bariatric surgery.               Teaching Method Utilized:  Visual Auditory    Learning Readiness:   Change in progress  Barriers to learning/adherence to lifestyle change: none  Demonstrated degree of understanding via:  Teach Back   Plan:  08/15/20 at 4:30pm.

## 2020-08-15 ENCOUNTER — Other Ambulatory Visit: Payer: Self-pay

## 2020-08-15 ENCOUNTER — Encounter: Payer: Self-pay | Admitting: Dietician

## 2020-08-15 ENCOUNTER — Encounter: Payer: 59 | Admitting: Dietician

## 2020-08-15 VITALS — Ht 67.0 in | Wt 256.8 lb

## 2020-08-15 DIAGNOSIS — Z6841 Body Mass Index (BMI) 40.0 and over, adult: Secondary | ICD-10-CM | POA: Diagnosis not present

## 2020-08-15 DIAGNOSIS — Z6839 Body mass index (BMI) 39.0-39.9, adult: Secondary | ICD-10-CM | POA: Diagnosis not present

## 2020-08-15 DIAGNOSIS — E669 Obesity, unspecified: Secondary | ICD-10-CM

## 2020-08-15 NOTE — Progress Notes (Signed)
Supervised Weight Loss for Bariatric Surgery Appt start time: 1600 end time:  1615.  SWL Appointment 7 of 12   Anthropometrics Start Weight at N[facility]: 254.9lbs  (07/01/20) Weight: 256.8lbs Height: 5'7"  BMI: 40.22  Clinical Medications: no changes Health/ medical history changes: no changes  Dietary/ Lifestyle Progress: . Patient is taking vinegar supplement pills as weight loss aid, which has increased thirst, but not heartburn. . She reports drinking more water in the past week, also eating ham leftover from Easter meal. She wonders if increased sodium intake has contributed to weight gain.  . 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 Nutrition counseling for weight loss prior to upcoming bariatric surgery. . Patient is considering an eating pattern of grilled chicken and low carb vegetables in hopes of stimulating weight loss, as this pattern has helped in the past.                Nutritional Diagnosis:  [place]-3.3 Overweight/obesity related to history of excess calories and inadequate physical activity as evidenced by patient with current BMI of 40.22, following dietary guidelines for weight loss prior to bariatric surgery.               Teaching Method Utilized:  Visual Auditory   Learning Readiness:   Change in progress  Barriers to learning/adherence to lifestyle change: none  Demonstrated degree of understanding via:  Teach Back   Plan:  follow up 08/21/20 at 4:30pm.

## 2020-08-21 ENCOUNTER — Other Ambulatory Visit: Payer: Self-pay

## 2020-08-21 ENCOUNTER — Encounter: Payer: 59 | Admitting: Dietician

## 2020-08-22 ENCOUNTER — Encounter: Payer: 59 | Admitting: Dietician

## 2020-08-22 ENCOUNTER — Encounter: Payer: Self-pay | Admitting: Dietician

## 2020-08-22 VITALS — Ht 67.0 in | Wt 256.0 lb

## 2020-08-22 DIAGNOSIS — Z6839 Body mass index (BMI) 39.0-39.9, adult: Secondary | ICD-10-CM | POA: Diagnosis not present

## 2020-08-22 DIAGNOSIS — Z6841 Body Mass Index (BMI) 40.0 and over, adult: Secondary | ICD-10-CM | POA: Diagnosis not present

## 2020-08-22 DIAGNOSIS — E669 Obesity, unspecified: Secondary | ICD-10-CM | POA: Diagnosis not present

## 2020-08-22 NOTE — Progress Notes (Signed)
Supervised Weight Loss for Bariatric Surgery Appt start time: 1630 end time:  2111  SWL Appointment 8 of 12   Anthropometrics Start Weight at N[facility]: 254.9lbs  (07/01/20) Weight: 256lbs Height: 5'7"  BMI: 40.1  Clinical Medications: no medication changes Health/ medical history changes: no changes  Dietary/ Lifestyle Progress: . Calorie intake remains low, although weight is not changing significantly.  . Patient continues to focus on lean protein foods and vegetables and is limiting carbohydrate intake. . She had an episode of bursitis last week.   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 Nutrition counseling for weight loss prior to upcoming bariatric surgery.               Nutritional Diagnosis:  [place]-3.3 Overweight/obesity related to history of excess calories and inadequate physical activity as evidenced by patient with current BMI of 40, following dietary guidelines for weight loss prior to bariatric surgery.               Teaching Method Utilized:  Visual Auditory   Learning Readiness:   Change in progress  Barriers to learning/adherence to lifestyle change: none  Demonstrated degree of understanding via:  Teach Back   Plan:  follow up for visit 9 on 08/29/20 at 4:30pm.

## 2020-08-23 DIAGNOSIS — Z6839 Body mass index (BMI) 39.0-39.9, adult: Secondary | ICD-10-CM | POA: Diagnosis not present

## 2020-08-23 DIAGNOSIS — K219 Gastro-esophageal reflux disease without esophagitis: Secondary | ICD-10-CM | POA: Diagnosis not present

## 2020-08-23 DIAGNOSIS — R0600 Dyspnea, unspecified: Secondary | ICD-10-CM | POA: Diagnosis not present

## 2020-08-23 DIAGNOSIS — Z1159 Encounter for screening for other viral diseases: Secondary | ICD-10-CM | POA: Diagnosis not present

## 2020-08-23 DIAGNOSIS — Z0000 Encounter for general adult medical examination without abnormal findings: Secondary | ICD-10-CM | POA: Diagnosis not present

## 2020-08-23 DIAGNOSIS — J4520 Mild intermittent asthma, uncomplicated: Secondary | ICD-10-CM | POA: Insufficient documentation

## 2020-08-23 DIAGNOSIS — E669 Obesity, unspecified: Secondary | ICD-10-CM | POA: Diagnosis not present

## 2020-08-29 ENCOUNTER — Other Ambulatory Visit: Payer: Self-pay

## 2020-08-29 ENCOUNTER — Encounter: Payer: Self-pay | Admitting: Dietician

## 2020-08-29 ENCOUNTER — Encounter: Payer: 59 | Attending: Surgery | Admitting: Dietician

## 2020-08-29 DIAGNOSIS — E669 Obesity, unspecified: Secondary | ICD-10-CM | POA: Insufficient documentation

## 2020-08-29 DIAGNOSIS — Z6841 Body Mass Index (BMI) 40.0 and over, adult: Secondary | ICD-10-CM | POA: Diagnosis not present

## 2020-08-29 DIAGNOSIS — Z6839 Body mass index (BMI) 39.0-39.9, adult: Secondary | ICD-10-CM | POA: Diagnosis not present

## 2020-08-29 NOTE — Patient Instructions (Signed)
   Continue with current eating pattern as long as hunger is controlled.   Consider taking a multivitamin to avoid any nutritional deficiencies during period of low calorie/ food intake.

## 2020-08-29 NOTE — Progress Notes (Signed)
Supervised Weight Loss for Bariatric Surgery Appt start time: 1500 end time:  1530.  SWL Appointment 9 of 12   Anthropometrics Start Weight at [facility]: 254.9lbs  (07/01/20) Weight: 255.3lbs Height: 5'7"  BMI: 39.99  Clinical Medications: no changes Health/ medical history changes: no changes  Dietary/ Lifestyle Progress: . Patient reports some swelling in ankles, feels she might be retaining some fluid . She has begun using a supplement to promote fulness and has felt much less hungry, perhaps more energy.   Dietary intake: Eating Pattern: 1 meal, 2 meal replacement shakes and 1-2 snacks daily Dining out: 0-1 meal/ week  Breakfast: slimfast protein shake + one fruit Snack: occasionally almonds  Lunch: 1 1/2 slices [place] on small pita with carrots, celery Snack: occasionally almonds Dinner: sometimes protein shake Snack: none Beverages: water, some "simply fit" mix-in with 20-30oz water, coffee in am  Usual physical activity: 14000 steps daily + on the job activity    Intervention:   . Nutrition counseling for weight loss prior to upcoming bariatric surgery. . Discussed likely need for multivitamin supplementation with food intake dropping to one small meal daily. Patient would like to continue this pattern for one more week to see if her weight will decrease, as she has been frustrated with lack of weight loss thus far.                Nutritional Diagnosis:  [place]-3.3 Overweight/obesity related to history of excess calories and inadequate physical activity as evidenced by patient with current BMI of 39.99, following low calorie eating pattern for weight loss prior to bariatric surgery.               Teaching Method Utilized:  Visual Auditory Hands on   Learning Readiness:   Change in progress  Barriers to learning/adherence to lifestyle change: none  Demonstrated degree of understanding via:  Teach Back   Plan:  follow up 09/05/20 at 4:30pm for weight loss visit #10.

## 2020-09-05 ENCOUNTER — Encounter: Payer: 59 | Admitting: Dietician

## 2020-09-05 ENCOUNTER — Other Ambulatory Visit: Payer: Self-pay

## 2020-09-12 ENCOUNTER — Other Ambulatory Visit: Payer: Self-pay

## 2020-09-12 ENCOUNTER — Encounter: Payer: 59 | Admitting: Dietician

## 2020-09-12 ENCOUNTER — Ambulatory Visit: Payer: 59 | Admitting: Dietician

## 2020-09-12 ENCOUNTER — Encounter: Payer: Self-pay | Admitting: Dietician

## 2020-09-12 VITALS — Ht 67.0 in | Wt 254.0 lb

## 2020-09-12 DIAGNOSIS — Z6839 Body mass index (BMI) 39.0-39.9, adult: Secondary | ICD-10-CM | POA: Diagnosis not present

## 2020-09-12 DIAGNOSIS — Z6841 Body Mass Index (BMI) 40.0 and over, adult: Secondary | ICD-10-CM | POA: Diagnosis not present

## 2020-09-12 DIAGNOSIS — E669 Obesity, unspecified: Secondary | ICD-10-CM | POA: Diagnosis not present

## 2020-09-12 NOTE — Patient Instructions (Signed)
Continue with current eating pattern and physical activity

## 2020-09-12 NOTE — Progress Notes (Signed)
Supervised Weight Loss for Bariatric Surgery Appt start time: 1610 end time:  1630.  SWL Appointment 10 of 12   Anthropometrics Start Weight at [facility]: 254.9lbs  (07/01/20) Weight: 254lbs Height: 5'7"  BMI: 39.78  Clinical Medications: no changes Health/ medical history changes: no changes  Dietary/ Lifestyle Progress: . Patient reports maintaining low carb, high protein/ vegetable intake for most meals. She had some restaurant meals but ate small portions and ordered healthy options.  . She has been feeling more fatigued about 5pm most days, which she feels might be due to decrease in caffeine intake. She has now started taking a B12 supplement in the afternoon.    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 Nutrition counseling for weight loss prior to upcoming bariatric surgery.               Nutritional Diagnosis:  [place]-3.3 Overweight/obesity related to history of excess calories and inadequate physical activity as evidenced by patient with current BMI of 39.78, following dietary guidelines for weight loss prior to bariatric surgery.               Teaching Method Utilized:  Auditory   Learning Readiness:   Change in progress  Barriers to learning/adherence to lifestyle change: none  Demonstrated degree of understanding via:  Teach Back   Plan:  follow up 09/16/20 at 4:00pm.

## 2020-09-16 ENCOUNTER — Encounter: Payer: Self-pay | Admitting: Dietician

## 2020-09-16 ENCOUNTER — Encounter: Payer: 59 | Admitting: Dietician

## 2020-09-16 ENCOUNTER — Other Ambulatory Visit: Payer: Self-pay

## 2020-09-16 ENCOUNTER — Telehealth: Payer: 59 | Admitting: Dietician

## 2020-09-16 VITALS — Ht 67.0 in | Wt 252.1 lb

## 2020-09-16 DIAGNOSIS — Z6839 Body mass index (BMI) 39.0-39.9, adult: Secondary | ICD-10-CM

## 2020-09-16 DIAGNOSIS — E669 Obesity, unspecified: Secondary | ICD-10-CM | POA: Diagnosis not present

## 2020-09-16 DIAGNOSIS — Z6841 Body Mass Index (BMI) 40.0 and over, adult: Secondary | ICD-10-CM | POA: Diagnosis not present

## 2020-09-16 NOTE — Patient Instructions (Signed)
Continue with current eating and activity pattern.

## 2020-09-16 NOTE — Progress Notes (Signed)
Supervised Weight Loss for Bariatric Surgery Appt start time: 1610 end time:  1625  SWL Appointment 11 of 12   Anthropometrics Start Weight at [facility]: 254.9lbs  (07/01/20) Weight: 252.1lbs Height: 5'7"  BMI: 39.48  Clinical Medications: albuterol, pantoprazole, Vitamin B12, Vitamin D3, vinegar supplement Health/ medical history changes: no changes  Dietary/ Lifestyle Progress: . Patient continues with low carb meals and snacks and small/ controlled food portions. She is working diligently to achieve weight loss.    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 Nutrition counseling for weight loss prior to upcoming bariatric surgery.               Nutritional Diagnosis:  [place]-3.3 Overweight/obesity related to history of excess calories and inadequate physical activity as evidenced by patient with current BMI of 39.48, following dietary guidelines for weight loss prior to bariatric surgery.               Teaching Method Utilized:  Visual Auditory   Learning Readiness:   Change in progress  Barriers to learning/adherence to lifestyle change: none  Demonstrated degree of understanding via:  Teach Back   Plan:  return 09/19/20 for next (final) weight loss visit.

## 2020-09-18 ENCOUNTER — Telehealth: Payer: 59 | Admitting: Dietician

## 2020-09-19 ENCOUNTER — Encounter: Payer: 59 | Admitting: Dietician

## 2020-09-19 ENCOUNTER — Other Ambulatory Visit: Payer: Self-pay

## 2020-09-20 ENCOUNTER — Encounter: Payer: Self-pay | Admitting: Dietician

## 2020-09-20 ENCOUNTER — Encounter: Payer: 59 | Admitting: Dietician

## 2020-09-20 VITALS — Ht 67.0 in | Wt 250.1 lb

## 2020-09-20 DIAGNOSIS — E669 Obesity, unspecified: Secondary | ICD-10-CM | POA: Diagnosis not present

## 2020-09-20 DIAGNOSIS — Z6839 Body mass index (BMI) 39.0-39.9, adult: Secondary | ICD-10-CM

## 2020-09-20 DIAGNOSIS — Z6841 Body Mass Index (BMI) 40.0 and over, adult: Secondary | ICD-10-CM | POA: Diagnosis not present

## 2020-09-20 NOTE — Patient Instructions (Signed)
continue with current eating pattern and regular physical activity

## 2020-09-20 NOTE — Progress Notes (Signed)
Supervised Weight Loss for Bariatric Surgery Appt start time: 1300 end time:  8341  SWL Appointment 12 of 12   Anthropometrics Start Weight at [facility]: 254.9lbs  (07/01/20) Weight: 250.1lbs Height: 5'7"  BMI: 39.17  Clinical Medications: albuterol, pantoprazole, vitamin B12, vitamin D Health/ medical history changes: no changes  Dietary/ Lifestyle Progress: . Patient continues to limit food portions and carbohydrate choices. She feels the "Darci Needle" drink she consumes daily is helping reduce hunger and promote fulness.    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 Nutrition counseling for weight loss prior to upcoming bariatric surgery. . Reviewed schedule for and guidelines for pre-op diet . Discussed options for liquid/ soft protein post-op.  . Instructed on MNT follow up plan after surgery. . Patient has been working diligently on diet and lifestyle changes to promote weight loss, and is motivated to continue. . Patient is able to voice understanding of pre- and post-op diet guidelines.  . She has solid support from family and coworkers.  . From a nutrition standpoint, she is prepared and is a good candidate for weight loss surgery.               Nutritional Diagnosis:  [place]-3.3 Overweight/obesity related to history of excess calories and inadequate physical activity as evidenced by patient with current BMI of 39.17, following dietary guidelines for weight loss prior to bariatric surgery.                Teaching Method Utilized:  Visual Auditory   Learning Readiness:   Change in progress  Barriers to learning/adherence to lifestyle change: none  Demonstrated degree of understanding via:  Teach Back   Plan:  return for pre-op class at least 2 weeks before surgery date.  After surgery, retrn for MNT follow up beginning 2 weeks post-op.Marland Kitchen

## 2020-09-25 ENCOUNTER — Other Ambulatory Visit: Payer: Self-pay

## 2020-09-25 ENCOUNTER — Encounter: Payer: Self-pay | Admitting: Adult Health

## 2020-09-25 ENCOUNTER — Ambulatory Visit: Payer: Self-pay | Admitting: Adult Health

## 2020-09-25 VITALS — BP 111/75 | HR 100 | Temp 97.6°F | Resp 14 | Ht 68.0 in | Wt 250.0 lb

## 2020-09-25 DIAGNOSIS — M5489 Other dorsalgia: Secondary | ICD-10-CM

## 2020-09-25 LAB — POCT URINALYSIS DIPSTICK
Bilirubin, UA: NEGATIVE
Blood, UA: NEGATIVE
Glucose, UA: NEGATIVE
Ketones, UA: NEGATIVE
Leukocytes, UA: NEGATIVE
Nitrite, UA: NEGATIVE
Protein, UA: POSITIVE — AB
Spec Grav, UA: >=1.03 — AB (ref 1.010–1.025)
Urobilinogen, UA: 0.2 E.U./dL
pH, UA: 6 (ref 5.0–8.0)

## 2020-09-25 MED ORDER — CYCLOBENZAPRINE HCL 10 MG PO TABS: 10.0000 mg | ORAL_TABLET | Freq: Every evening | ORAL | 0 refills | Status: DC | PRN

## 2020-09-25 NOTE — Progress Notes (Signed)
Pt states she's having mid lower back pain near ribs. Pt started having 3 months ago after she had covid. CL,RMA

## 2020-09-25 NOTE — Progress Notes (Signed)
[facility] [place], [place] [address]   Office Visit Note  Patient Name: [name]  [id number]  [id number]  Date of Service: 09/25/2020  Chief Complaint  Patient presents with  . Nephrolithiasis     HPI Pt is here for acute visit.  She reports having some right flank pain for the last 2-3 months.  She initially thought it was from coughing during her covid infection, but now is concerned it is a kidney stone. She reports the pain is work with twisting and moving or with palpation.    Current Medication:  Outpatient Encounter Medications as of 09/25/2020  Medication Sig  . albuterol (PROVENTIL) (2.5 MG/3ML) 0.083% nebulizer solution albuterol sulfate 2.5 mg/3 mL (0.083 %) solution for nebulization  VVN QID PRN  . Cyanocobalamin (VITAMIN B-12) 5000 MCG SUBL Place under the tongue once a week.  . cyclobenzaprine (FLEXERIL) 10 MG tablet Take 1 tablet (10 mg total) by mouth at bedtime as needed for muscle spasms.  . pantoprazole (PROTONIX) 40 MG tablet Take 1 tablet (40 mg total) by mouth daily.  Marland Kitchen VITAMIN D PO Take by mouth.   No facility-administered encounter medications on file as of 09/25/2020.      Medical History: Past Medical History:  Diagnosis Date  . Anxiety   . Depression   . Elevated lipids   . GERD (gastroesophageal reflux disease)   . Obesity      Vital Signs: BP 111/75   Pulse 100   Temp 97.6 F (36.4 C)   Resp 14   Ht 5\' 8"  (1.727 m)   Wt 250 lb (113.4 kg)   SpO2 96%   BMI 38.01 kg/m    Review of Systems  Constitutional: Negative for activity change.  Genitourinary: Positive for flank pain. Negative for decreased urine volume, difficulty urinating, frequency, urgency, vaginal bleeding and vaginal discharge.  Musculoskeletal: Positive for back pain.  Skin: Negative for rash and wound.    Physical Exam Musculoskeletal:        General: Tenderness present.     Comments: Pain a base of ribs with palpation near right  flank.     Assessment/Plan: 1. Other back pain, unspecified chronicity Urine dip unremarkable. Discussed using heat for 20 minutes on, 2-3 times daily.  Take NSAID as tolerated, and use flexeril at night to help with muscle relaxation.  IF symptoms fail to improve, return to clinic for further evaluation.  - POCT urinalysis dipstick - cyclobenzaprine (FLEXERIL) 10 MG tablet; Take 1 tablet (10 mg total) by mouth at bedtime as needed for muscle spasms.  Dispense: 15 tablet; Refill: 0      General Counseling: [name] verbalizes understanding of the findings of todays visit and agrees with plan of treatment. I have discussed any further diagnostic evaluation that may be needed or ordered today. We also reviewed her medications today. she has been encouraged to call the office with any questions or concerns that should arise related to todays visit.   Orders Placed This Encounter  Procedures  . POCT urinalysis dipstick    Meds ordered this encounter  Medications  . cyclobenzaprine (FLEXERIL) 10 MG tablet    Sig: Take 1 tablet (10 mg total) by mouth at bedtime as needed for muscle spasms.    Dispense:  15 tablet    Refill:  0    Time spent: 20 Minutes    Kendell Bane AGNP-C Nurse Practitioner

## 2020-09-27 ENCOUNTER — Other Ambulatory Visit: Payer: Self-pay | Admitting: Physician Assistant

## 2020-09-27 DIAGNOSIS — Z1231 Encounter for screening mammogram for malignant neoplasm of breast: Secondary | ICD-10-CM

## 2020-10-14 ENCOUNTER — Ambulatory Visit (INDEPENDENT_AMBULATORY_CARE_PROVIDER_SITE_OTHER): Payer: 59 | Admitting: Psychology

## 2020-10-14 DIAGNOSIS — F509 Eating disorder, unspecified: Secondary | ICD-10-CM

## 2020-10-14 DIAGNOSIS — R69 Illness, unspecified: Secondary | ICD-10-CM | POA: Diagnosis not present

## 2020-10-24 ENCOUNTER — Other Ambulatory Visit: Payer: Self-pay

## 2020-10-24 ENCOUNTER — Ambulatory Visit
Admission: RE | Admit: 2020-10-24 | Discharge: 2020-10-24 | Disposition: A | Discharge status: Home / Self Care | Payer: 59 | Source: Ambulatory Visit | Attending: Physician Assistant | Admitting: Physician Assistant

## 2020-10-24 DIAGNOSIS — Z1231 Encounter for screening mammogram for malignant neoplasm of breast: Secondary | ICD-10-CM | POA: Diagnosis present

## 2020-11-08 ENCOUNTER — Encounter: Payer: 59 | Attending: Surgery

## 2020-11-08 ENCOUNTER — Ambulatory Visit: Payer: 59 | Admitting: Dietician

## 2020-11-08 ENCOUNTER — Other Ambulatory Visit: Payer: Self-pay

## 2020-11-08 VITALS — Ht 68.0 in | Wt 251.8 lb

## 2020-11-08 DIAGNOSIS — E669 Obesity, unspecified: Secondary | ICD-10-CM | POA: Insufficient documentation

## 2020-11-08 DIAGNOSIS — Z6839 Body mass index (BMI) 39.0-39.9, adult: Secondary | ICD-10-CM | POA: Insufficient documentation

## 2020-11-08 NOTE — Progress Notes (Signed)
Pre-Operative Nutrition Class:  Appt start time: 0900   End time:  1030  Patient was seen on 11/08/20 for Pre-Operative Bariatric Surgery Education at [facility].   Surgery date: 11/25/20 Surgery type: Vertical Sleeve Gastrectomy Start weight at [facility]: 256lbs Weight today: 251.8lbs  InBody  BODY COMP RESULTS    BMI (kg/m^2) 38.3  Fat Mass (lbs) 126.8  Dry Lean Mass (lbs) 33.5  Total Body Water (lbs) 91.5    Samples given per MNT protocol. Patient educated on appropriate usage:  Lot number Expiration date  Celebrate Vitamins Pack:  Calcium soft chews cherry   [id number]   03/2021  Calcium soft chews watermelon 1165 03/2021  Calcium soft chews straw-ban cream 1173 03/2021  Calcium soft chews cafe mocha 1166 03/2021  Calcium soft chews orange 1098 01/2021  Calcium soft chews blackberry 1166 03/2021  Calcium soft chews caramel 1130 02/2021  Calcium soft chews chocolate 1097 01/2021  Calcium soft chews island fruit 1095 01/2021  Calcium soft chews lemon cream 1123 02/2021      Iron soft chews 84m cherry burst [id number] 08/2021  Iron soft chews 653mtwisted citrus [id number]/2023             Unjury products:     Chocolate shake  [id number] 02/22/21    Unflavored powder [id number](indiv) [id number]/2022   09/2021  Chicken soup powder 120311  (indiv) [id number]/2022   09/2021    The following the learning objectives were met by the patient during this course: Identify Pre-Op Dietary Goals and will begin 2 weeks pre-operatively Identify appropriate sources of fluids and proteins  State protein recommendations and appropriate sources pre and post-operatively Identify Post-Operative Dietary Goals and will follow for 2 weeks post-operatively Identify appropriate multivitamin and calcium sources Describe the need for physical activity post-operatively and will follow MD recommendations State when to call healthcare provider regarding  medication questions or post-operative complications  Handouts given during class include: Pre-Op Bariatric Surgery Diet Handout Protein Shake Handout Post-Op Bariatric Surgery Nutrition Handout BELT Program Information Flyer Support Group Information [facility]er [facility]  [place]: [place] for diet advancement per MD.

## 2020-11-13 ENCOUNTER — Other Ambulatory Visit (HOSPITAL_COMMUNITY): Payer: Self-pay

## 2020-11-14 NOTE — Patient Instructions (Addendum)
DUE TO COVID-19 ONLY ONE VISITOR IS ALLOWED TO COME WITH YOU AND STAY IN THE WAITING ROOM ONLY DURING PRE OP AND PROCEDURE DAY OF SURGERY. THE 2 VISITORS  MAY VISIT WITH YOU AFTER SURGERY IN YOUR PRIVATE ROOM DURING VISITING HOURS ONLY!  YOU NEED TO HAVE A COVID 19 TEST ON__7/28_____ @_8 -1______, THIS TEST MUST BE DONE BEFORE SURGERY,                 [name]     Your procedure is scheduled on: 11/25/20   Report to [facility] Main  Entrance   Report to admitting at  8:15 AM     Call this number if you have problems the morning of surgery [place], [place].    NO SOLID FOOD AFTER 6:00 PM THE NIGHT BEFORE YOUR SURGERY.   YOU MAY DRINK CLEAR FLUIDS until 7:00 am  Drink a G2 drink before you leave the house. THE G2 DRINK YOU DRINK BEFORE YOU LEAVE HOME WILL BE THE LAST FLUIDS YOU DRINK BEFORE SURGERY.    PAIN IS EXPECTED AFTER SURGERY AND WILL NOT BE COMPLETELY ELIMINATED.   AMBULATION AND TYLENOL WILL HELP REDUCE INCISIONAL AND GAS PAIN. MOVEMENT IS KEY!  YOU ARE EXPECTED TO BE OUT OF BED WITHIN 4 HOURS OF ADMISSION TO YOUR PATIENT ROOM.  SITTING IN THE RECLINER THROUGHOUT THE DAY IS IMPORTANT FOR DRINKING FLUIDS AND MOVING GAS THROUGHOUT THE GI TRACT.  COMPRESSION STOCKINGS SHOULD BE WORN [place] UNLESS YOU ARE WALKING.   INCENTIVE SPIROMETER SHOULD BE USED EVERY HOUR WHILE AWAKE TO DECREASE POST-OPERATIVE COMPLICATIONS SUCH AS PNEUMONIA.  WHEN DISCHARGED HOME, IT IS IMPORTANT TO CONTINUE TO WALK EVERY HOUR AND USE THE INCENTIVE SPIROMETER EVERY HOUR.            Take these medicines the morning of surgery with A SIP OF WATER: Protonix                                You may not have any metal on your body including hair pins and              piercings  Do not wear jewelry, make-up, lotions, powders or perfumes, deodorant             Do not  wear nail polish on your fingernails.  Do not shave  48 hours prior to surgery.                 Do not bring valuables to the hospital. [place].  Contacts, dentures or bridgework may not be worn into surgery.                    Please read over the following fact sheets you were given: _____________________________________________________________________             [facility] - Preparing for Surgery Before surgery, you can play an important role.  Because skin is not sterile, your skin needs to be as free of germs as possible.  You can reduce the number of germs on your skin by washing with CHG (chlorahexidine gluconate) soap before surgery.  CHG is an antiseptic cleaner which kills germs and  bonds with the skin to continue killing germs even after washing. Please DO NOT use if you have an allergy to CHG or antibacterial soaps.  If your skin becomes reddened/irritated stop using the CHG and inform your nurse when you arrive at Short Stay. Do not shave (including legs and underarms) for at least 48 hours prior to the first CHG shower.    Please follow these instructions carefully:  1.  Shower with CHG Soap the night before surgery and the  morning of Surgery.  2.  If you choose to wash your hair, wash your hair first as usual with your  normal  shampoo.  3.  After you shampoo, rinse your hair and body thoroughly to remove the  shampoo.                                        4.  Use CHG as you would any other liquid soap.  You can apply chg directly  to the skin and wash                       Gently with a scrungie or clean washcloth.  5.  Apply the CHG Soap to your body ONLY FROM THE NECK DOWN.   Do not use on face/ open                           Wound or open sores. Avoid contact with eyes, ears mouth and genitals (private parts).                       Wash face,  Genitals (private parts) with your normal soap.             6.  Wash  thoroughly, paying special attention to the area where your surgery  will be performed.  7.  Thoroughly rinse your body with warm water from the neck down.  8.  DO NOT shower/wash with your normal soap after using and rinsing off  the CHG Soap.              9.  Pat yourself dry with a clean towel.            10.  Wear clean pajamas.            11.  Place clean sheets on your bed the night of your first shower and do not  sleep with pets. Day of Surgery : Do not apply any lotions/deodorants the morning of surgery.  Please wear clean clothes to the hospital/surgery center.  FAILURE TO FOLLOW THESE INSTRUCTIONS MAY RESULT IN THE CANCELLATION OF YOUR SURGERY PATIENT SIGNATURE_________________________________  NURSE SIGNATURE__________________________________  ________________________________________________________________________

## 2020-11-15 ENCOUNTER — Encounter (HOSPITAL_COMMUNITY)
Admission: RE | Admit: 2020-11-15 | Discharge: 2020-11-15 | Disposition: A | Discharge status: Home / Self Care | Payer: 59 | Source: Ambulatory Visit | Attending: Surgery | Admitting: Surgery

## 2020-11-15 ENCOUNTER — Encounter (HOSPITAL_COMMUNITY): Payer: Self-pay

## 2020-11-15 ENCOUNTER — Other Ambulatory Visit: Payer: Self-pay

## 2020-11-15 DIAGNOSIS — Z01818 Encounter for other preprocedural examination: Secondary | ICD-10-CM | POA: Insufficient documentation

## 2020-11-15 HISTORY — DX: Nausea with vomiting, unspecified: R11.2

## 2020-11-15 HISTORY — DX: Nausea with vomiting, unspecified: Z98.890

## 2020-11-15 HISTORY — DX: Other complications of anesthesia, initial encounter: T88.59XA

## 2020-11-15 LAB — CBC WITH DIFFERENTIAL/PLATELET
Abs Immature Granulocytes: 0.02 10*3/uL (ref 0.00–0.07)
Basophils Absolute: 0.1 10*3/uL (ref 0.0–0.1)
Basophils Relative: 1 %
Eosinophils Absolute: 0.1 10*3/uL (ref 0.0–0.5)
Eosinophils Relative: 2 %
HCT: 44.8 % (ref 36.0–46.0)
Hemoglobin: 14.5 g/dL (ref 12.0–15.0)
Immature Granulocytes: 0 %
Lymphocytes Relative: 30 %
Lymphs Abs: 2 10*3/uL (ref 0.7–4.0)
MCH: 29.1 pg (ref 26.0–34.0)
MCHC: 32.4 g/dL (ref 30.0–36.0)
MCV: 89.8 fL (ref 80.0–100.0)
Monocytes Absolute: 0.5 10*3/uL (ref 0.1–1.0)
Monocytes Relative: 8 %
Neutro Abs: 3.9 10*3/uL (ref 1.7–7.7)
Neutrophils Relative %: 59 %
Platelets: 260 10*3/uL (ref 150–400)
RBC: 4.99 MIL/uL (ref 3.87–5.11)
RDW: 12.6 % (ref 11.5–15.5)
WBC: 6.6 10*3/uL (ref 4.0–10.5)
nRBC: 0 % (ref 0.0–0.2)

## 2020-11-15 LAB — COMPREHENSIVE METABOLIC PANEL
ALT: 28 U/L (ref 0–44)
AST: 47 U/L — ABNORMAL HIGH (ref 15–41)
Albumin: 4.4 g/dL (ref 3.5–5.0)
Alkaline Phosphatase: 64 U/L (ref 38–126)
Anion gap: 9 (ref 5–15)
BUN: 15 mg/dL (ref 6–20)
CO2: 26 mmol/L (ref 22–32)
Calcium: 9.5 mg/dL (ref 8.9–10.3)
Chloride: 106 mmol/L (ref 98–111)
Creatinine, Ser: 0.45 mg/dL (ref 0.44–1.00)
GFR, Estimated: >60 mL/min (ref >60)
Glucose, Bld: 97 mg/dL (ref 70–99)
Potassium: 4.3 mmol/L (ref 3.5–5.1)
Sodium: 141 mmol/L (ref 135–145)
Total Bilirubin: 1 mg/dL (ref 0.3–1.2)
Total Protein: 7.4 g/dL (ref 6.5–8.1)

## 2020-11-15 NOTE — Progress Notes (Addendum)
COVID Vaccine Completed:No Date COVID Vaccine completed: COVID vaccine manufacturer: Pfizer    [employer]   PCP - Occupational Dr. For city [place] 07/17/20 Cardiologist - none  Chest x-ray - 05/29/20 EKG - 11/15/20-chart Stress Test - no ECHO - no Cardiac Cath - no Pacemaker/ICD device last checked:NA  Sleep Study - no CPAP -   Fasting Blood Sugar - NA Checks Blood Sugar _____ times a day  Blood Thinner Instructions:NA Aspirin Instructions: Last Dose:  Anesthesia review: no  Patient denies shortness of breath, fever, cough and chest pain at PAT appointment Pt reports no SOB with any activities  Patient verbalized understanding of instructions that were given to them at the PAT appointment. Patient was also instructed that they will need to review over the PAT instructions again at home before surgery. yes

## 2020-11-21 ENCOUNTER — Other Ambulatory Visit: Payer: Self-pay

## 2020-11-21 ENCOUNTER — Other Ambulatory Visit
Admission: RE | Admit: 2020-11-21 | Discharge: 2020-11-21 | Disposition: A | Discharge status: Home / Self Care | Payer: 59 | Source: Ambulatory Visit | Attending: Surgery | Admitting: Surgery

## 2020-11-21 DIAGNOSIS — Z20822 Contact with and (suspected) exposure to covid-19: Secondary | ICD-10-CM | POA: Diagnosis not present

## 2020-11-21 DIAGNOSIS — Z01812 Encounter for preprocedural laboratory examination: Secondary | ICD-10-CM | POA: Diagnosis not present

## 2020-11-21 LAB — SARS CORONAVIRUS 2 (TAT 6-24 HRS): SARS Coronavirus 2: NEGATIVE

## 2020-11-24 MED ORDER — BUPIVACAINE LIPOSOME 1.3 % IJ SUSP
20.0000 mL | INTRAMUSCULAR | Status: DC
  Filled 2020-11-24: qty 20

## 2020-11-24 NOTE — H&P (Signed)
CC Obesity History of Present Illness: [name] is a 50 y.o. female who presents for sleeve gastrectomy. .   She comes in today for a preop visit for sleeve gastrectomy scheduled on August 1 with a robotic platform. She may have obstructive sleep apnea but its not definite. She does not use a CPAP machine. Her upper GI did show that she had no hiatal hernia but did have evidence of gastroesophageal reflux. We will look at her hiatus and see if there is something we can repair at that time. She watches what she eats and she said she is really not been symptomatic.  We reviewed the procedure in some detail and she wants to and is ready to move forward on August 1. BMI today is 39 with a weight of 250.2  Review of Systems: See HPI as well for other ROS.  Review of Systems  Gastrointestinal: Positive for heartburn.    Medical History: Past Medical History:  Diagnosis Date   GERD (gastroesophageal reflux disease)   Patient Active Problem List  Diagnosis   Polyarthralgia   Obesity (BMI 30-39.9), unspecified   Primary osteoarthritis involving multiple joints   Past Surgical History:  Procedure Laterality Date   LAPAROSCOPIC HYSTERECTOMY TOTAL    Allergies  Allergen Reactions   Ciprofloxacin Nausea and Other (See Comments)  Caused bad headache   Current Outpatient Medications on File Prior to Visit  Medication Sig Dispense Refill   cyanocobalamin, vitamin B-12, 5,000 mcg Subl Place under the tongue   pantoprazole (PROTONIX) 40 MG DR tablet Take 40 mg by mouth once daily.   No current facility-administered medications on file prior to visit.   Family History  Problem Relation Age of Onset   Irregular Heart Beat (Arrhythmia) Mother   Diabetes Mother   Cancer Paternal Aunt    Social History   Tobacco Use  Smoking Status Former Smoker   Quit date: 2020   Years since quitting: 2.5  Smokeless Tobacco Never Used    Social History   Socioeconomic History    Marital status: Married  Tobacco Use   Smoking status: Former Smoker  Quit date: 2020  Years since quitting: 2.5   Smokeless tobacco: Never Used  Scientific laboratory technician Use: Never used  Substance and Sexual Activity   Alcohol use: Yes  Alcohol/week: 0.0 standard drinks   Drug use: No   Sexual activity: Defer   Objective:   Vitals:  11/13/20 1443  BP: 100/70  Pulse: 110  Weight: (!) 113.5 kg (250 lb 3.2 oz)  Height: 170.8 cm (5' 7.25")   Body mass index is 38.9 kg/m.  Physical Exam General: Pleasant obese white female no acute distress HEENT: Unremarkable Chest: Clear to auscultation Heart: Sinus rhythm without murmurs or gallops Breast: Not examined Abdomen: Nontender prior hysterectomy laparoscopically and lap chole GU not examined Rectal not performed Extremities full range of motion Neuro alert and oriented x3. Motor and sensory function are grossly intact  Labs, Imaging and Diagnostic Testing: Upper GI reviewed and showed no hiatal hernia but significant reflux. No evidence of esophagitis  Assessment and Plan:  Diagnoses and all orders for this visit:  Morbid (severe) obesity due to excess calories (CMS-HCC)    Patient is set for robotic sleeve gastrectomy on August 1. Orders have been placed in epic.  No follow-ups on file.  Kiandria Clum Donia Pounds, MD

## 2020-11-25 ENCOUNTER — Inpatient Hospital Stay (HOSPITAL_COMMUNITY)
Admission: RE | Admit: 2020-11-25 | Discharge: 2020-11-26 | DRG: 621 | Disposition: A | Discharge status: Home / Self Care | Payer: 59 | Attending: Surgery | Admitting: Surgery

## 2020-11-25 ENCOUNTER — Inpatient Hospital Stay (HOSPITAL_COMMUNITY): Payer: 59 | Admitting: Certified Registered Nurse Anesthetist

## 2020-11-25 ENCOUNTER — Other Ambulatory Visit: Payer: Self-pay

## 2020-11-25 ENCOUNTER — Encounter (HOSPITAL_COMMUNITY): Payer: Self-pay | Admitting: Surgery

## 2020-11-25 ENCOUNTER — Encounter (HOSPITAL_COMMUNITY)
Admission: RE | Disposition: A | Discharge status: Home / Self Care | Payer: Self-pay | Source: Home / Self Care | Attending: Surgery

## 2020-11-25 DIAGNOSIS — Z881 Allergy status to other antibiotic agents status: Secondary | ICD-10-CM

## 2020-11-25 DIAGNOSIS — K219 Gastro-esophageal reflux disease without esophagitis: Secondary | ICD-10-CM | POA: Diagnosis not present

## 2020-11-25 DIAGNOSIS — Z87891 Personal history of nicotine dependence: Secondary | ICD-10-CM | POA: Diagnosis not present

## 2020-11-25 DIAGNOSIS — Z79899 Other long term (current) drug therapy: Secondary | ICD-10-CM

## 2020-11-25 DIAGNOSIS — Z6839 Body mass index (BMI) 39.0-39.9, adult: Secondary | ICD-10-CM | POA: Diagnosis not present

## 2020-11-25 DIAGNOSIS — Z903 Acquired absence of stomach [part of]: Secondary | ICD-10-CM

## 2020-11-25 DIAGNOSIS — K449 Diaphragmatic hernia without obstruction or gangrene: Secondary | ICD-10-CM | POA: Diagnosis present

## 2020-11-25 DIAGNOSIS — M159 Polyosteoarthritis, unspecified: Secondary | ICD-10-CM | POA: Diagnosis present

## 2020-11-25 HISTORY — PX: UPPER GI ENDOSCOPY: SHX6162

## 2020-11-25 LAB — TYPE AND SCREEN
ABO/RH(D): O POS
Antibody Screen: NEGATIVE

## 2020-11-25 LAB — HEMOGLOBIN AND HEMATOCRIT, BLOOD
HCT: 44.5 % (ref 36.0–46.0)
Hemoglobin: 14.3 g/dL (ref 12.0–15.0)

## 2020-11-25 LAB — CREATININE, SERUM
Creatinine, Ser: 0.69 mg/dL (ref 0.44–1.00)
GFR, Estimated: >60 mL/min (ref >60)

## 2020-11-25 SURGERY — GASTRECTOMY, SLEEVE, ROBOT-ASSISTED
Anesthesia: General | Site: Abdomen

## 2020-11-25 MED ORDER — CHLORHEXIDINE GLUCONATE CLOTH 2 % EX PADS: 6.0000 | MEDICATED_PAD | Freq: Once | CUTANEOUS | Status: DC

## 2020-11-25 MED ORDER — LACTATED RINGERS IV SOLN: INTRAVENOUS | Status: DC

## 2020-11-25 MED ORDER — LACTATED RINGERS IR SOLN
Status: DC | PRN
  Administered 2020-11-25: 1000 mL

## 2020-11-25 MED ORDER — PROMETHAZINE HCL 25 MG/ML IJ SOLN
INTRAMUSCULAR | Status: AC
  Filled 2020-11-25: qty 1

## 2020-11-25 MED ORDER — ACETAMINOPHEN 500 MG PO TABS
1000.0000 mg | ORAL_TABLET | ORAL | Status: AC
  Administered 2020-11-25: 1000 mg via ORAL
  Filled 2020-11-25: qty 2

## 2020-11-25 MED ORDER — APREPITANT 40 MG PO CAPS
40.0000 mg | ORAL_CAPSULE | ORAL | Status: AC
  Administered 2020-11-25: 40 mg via ORAL
  Filled 2020-11-25: qty 1

## 2020-11-25 MED ORDER — BUPIVACAINE LIPOSOME 1.3 % IJ SUSP
INTRAMUSCULAR | Status: DC | PRN
  Administered 2020-11-25: 20 mL

## 2020-11-25 MED ORDER — PHENYLEPHRINE HCL (PRESSORS) 10 MG/ML IV SOLN
INTRAVENOUS | Status: DC | PRN
  Administered 2020-11-25: 120 ug via INTRAVENOUS
  Administered 2020-11-25: 80 ug via INTRAVENOUS
  Administered 2020-11-25: 120 ug via INTRAVENOUS
  Administered 2020-11-25: 80 ug via INTRAVENOUS

## 2020-11-25 MED ORDER — FENTANYL CITRATE (PF) 250 MCG/5ML IJ SOLN
INTRAMUSCULAR | Status: DC | PRN
  Administered 2020-11-25: 100 ug via INTRAVENOUS

## 2020-11-25 MED ORDER — ONDANSETRON HCL 4 MG/2ML IJ SOLN
INTRAMUSCULAR | Status: AC
  Filled 2020-11-25: qty 2

## 2020-11-25 MED ORDER — SODIUM CHLORIDE 0.9 % IV SOLN
2.0000 g | INTRAVENOUS | Status: AC
  Administered 2020-11-25: 2 g via INTRAVENOUS
  Filled 2020-11-25: qty 2

## 2020-11-25 MED ORDER — OXYCODONE HCL 5 MG/5ML PO SOLN
5.0000 mg | Freq: Four times a day (QID) | ORAL | Status: DC | PRN
  Administered 2020-11-25: 5 mg via ORAL
  Filled 2020-11-25: qty 5

## 2020-11-25 MED ORDER — DEXAMETHASONE SODIUM PHOSPHATE 10 MG/ML IJ SOLN
INTRAMUSCULAR | Status: DC | PRN
  Administered 2020-11-25: 10 mg via INTRAVENOUS

## 2020-11-25 MED ORDER — CHLORHEXIDINE GLUCONATE 0.12 % MT SOLN
15.0000 mL | Freq: Once | OROMUCOSAL | Status: AC
  Administered 2020-11-25: 15 mL via OROMUCOSAL

## 2020-11-25 MED ORDER — AMISULPRIDE (ANTIEMETIC) 5 MG/2ML IV SOLN
INTRAVENOUS | Status: AC
  Filled 2020-11-25: qty 4

## 2020-11-25 MED ORDER — HYDROMORPHONE HCL 1 MG/ML IJ SOLN
0.2500 mg | INTRAMUSCULAR | Status: DC | PRN
  Administered 2020-11-25 (×2): 0.25 mg via INTRAVENOUS

## 2020-11-25 MED ORDER — MIDAZOLAM HCL 5 MG/5ML IJ SOLN
INTRAMUSCULAR | Status: DC | PRN
  Administered 2020-11-25: 2 mg via INTRAVENOUS

## 2020-11-25 MED ORDER — 0.9 % SODIUM CHLORIDE (POUR BTL) OPTIME
TOPICAL | Status: DC | PRN
  Administered 2020-11-25: 1000 mL

## 2020-11-25 MED ORDER — MEPERIDINE HCL 50 MG/ML IJ SOLN: 6.2500 mg | INTRAMUSCULAR | Status: DC | PRN

## 2020-11-25 MED ORDER — PHENYLEPHRINE HCL-NACL 10-0.9 MG/250ML-% IV SOLN
INTRAVENOUS | Status: DC | PRN
  Administered 2020-11-25: 30 ug/min via INTRAVENOUS

## 2020-11-25 MED ORDER — ROCURONIUM BROMIDE 10 MG/ML (PF) SYRINGE
PREFILLED_SYRINGE | INTRAVENOUS | Status: AC
  Filled 2020-11-25: qty 10

## 2020-11-25 MED ORDER — DROPERIDOL 2.5 MG/ML IJ SOLN: 0.6250 mg | Freq: Once | INTRAMUSCULAR | Status: DC | PRN

## 2020-11-25 MED ORDER — MIDAZOLAM HCL 2 MG/2ML IJ SOLN
INTRAMUSCULAR | Status: AC
  Filled 2020-11-25: qty 2

## 2020-11-25 MED ORDER — LIDOCAINE 2% (20 MG/ML) 5 ML SYRINGE
INTRAMUSCULAR | Status: AC
  Filled 2020-11-25: qty 5

## 2020-11-25 MED ORDER — PROPOFOL 10 MG/ML IV BOLUS
INTRAVENOUS | Status: DC | PRN
Start: 2020-11-25 — End: 2020-11-25
  Administered 2020-11-25: 200 mg via INTRAVENOUS

## 2020-11-25 MED ORDER — PROPOFOL 500 MG/50ML IV EMUL
INTRAVENOUS | Status: DC | PRN
  Administered 2020-11-25: 25 ug/kg/min via INTRAVENOUS

## 2020-11-25 MED ORDER — ONDANSETRON HCL 4 MG/2ML IJ SOLN
4.0000 mg | INTRAMUSCULAR | Status: DC | PRN
  Administered 2020-11-25 – 2020-11-26 (×2): 4 mg via INTRAVENOUS
  Filled 2020-11-25 (×2): qty 2

## 2020-11-25 MED ORDER — ORAL CARE MOUTH RINSE: 15.0000 mL | Freq: Once | OROMUCOSAL | Status: AC

## 2020-11-25 MED ORDER — HYDROMORPHONE HCL 1 MG/ML IJ SOLN
INTRAMUSCULAR | Status: AC
  Filled 2020-11-25: qty 1

## 2020-11-25 MED ORDER — HEPARIN SODIUM (PORCINE) 5000 UNIT/ML IJ SOLN
5000.0000 [IU] | Freq: Three times a day (TID) | INTRAMUSCULAR | Status: DC
  Administered 2020-11-25 – 2020-11-26 (×3): 5000 [IU] via SUBCUTANEOUS
  Filled 2020-11-25 (×3): qty 1

## 2020-11-25 MED ORDER — SUGAMMADEX SODIUM 200 MG/2ML IV SOLN
INTRAVENOUS | Status: DC | PRN
  Administered 2020-11-25: 200 mg via INTRAVENOUS

## 2020-11-25 MED ORDER — LIDOCAINE 2% (20 MG/ML) 5 ML SYRINGE
INTRAMUSCULAR | Status: DC | PRN
  Administered 2020-11-25: 20 mg via INTRAVENOUS
  Administered 2020-11-25: 80 mg via INTRAVENOUS

## 2020-11-25 MED ORDER — KCL IN DEXTROSE-NACL 20-5-0.45 MEQ/L-%-% IV SOLN
INTRAVENOUS | Status: DC
  Filled 2020-11-25 (×2): qty 1000

## 2020-11-25 MED ORDER — FENTANYL CITRATE (PF) 250 MCG/5ML IJ SOLN
INTRAMUSCULAR | Status: AC
  Filled 2020-11-25: qty 5

## 2020-11-25 MED ORDER — ONDANSETRON HCL 4 MG/2ML IJ SOLN
INTRAMUSCULAR | Status: DC | PRN
  Administered 2020-11-25: 4 mg via INTRAVENOUS

## 2020-11-25 MED ORDER — ROCURONIUM BROMIDE 10 MG/ML (PF) SYRINGE
PREFILLED_SYRINGE | INTRAVENOUS | Status: DC | PRN
  Administered 2020-11-25: 20 mg via INTRAVENOUS
  Administered 2020-11-25: 60 mg via INTRAVENOUS
  Administered 2020-11-25: 20 mg via INTRAVENOUS

## 2020-11-25 MED ORDER — DEXAMETHASONE SODIUM PHOSPHATE 10 MG/ML IJ SOLN
INTRAMUSCULAR | Status: AC
  Filled 2020-11-25: qty 1

## 2020-11-25 MED ORDER — PROMETHAZINE HCL 25 MG/ML IJ SOLN
6.2500 mg | INTRAMUSCULAR | Status: AC | PRN
  Administered 2020-11-25 (×2): 12.5 mg via INTRAVENOUS

## 2020-11-25 MED ORDER — PANTOPRAZOLE SODIUM 40 MG IV SOLR
40.0000 mg | Freq: Every day | INTRAVENOUS | Status: DC
  Administered 2020-11-25: 40 mg via INTRAVENOUS
  Filled 2020-11-25: qty 40

## 2020-11-25 MED ORDER — ACETAMINOPHEN 500 MG PO TABS
1000.0000 mg | ORAL_TABLET | Freq: Three times a day (TID) | ORAL | Status: DC
  Administered 2020-11-26 (×2): 1000 mg via ORAL
  Filled 2020-11-25 (×3): qty 2

## 2020-11-25 MED ORDER — ACETAMINOPHEN 160 MG/5ML PO SOLN: 1000.0000 mg | Freq: Three times a day (TID) | ORAL | Status: DC

## 2020-11-25 MED ORDER — PROPOFOL 10 MG/ML IV BOLUS
INTRAVENOUS | Status: AC
  Filled 2020-11-25: qty 20

## 2020-11-25 MED ORDER — MORPHINE SULFATE (PF) 2 MG/ML IV SOLN: 1.0000 mg | INTRAVENOUS | Status: DC | PRN

## 2020-11-25 MED ORDER — PHENYLEPHRINE HCL (PRESSORS) 10 MG/ML IV SOLN
INTRAVENOUS | Status: AC
  Filled 2020-11-25: qty 1

## 2020-11-25 MED ORDER — AMISULPRIDE (ANTIEMETIC) 5 MG/2ML IV SOLN
10.0000 mg | Freq: Once | INTRAVENOUS | Status: DC | PRN
Start: 2020-11-25 — End: 2020-11-25

## 2020-11-25 MED ORDER — ENSURE MAX PROTEIN PO LIQD
2.0000 [oz_av] | ORAL | Status: DC
  Administered 2020-11-26 (×5): 2 [oz_av] via ORAL

## 2020-11-25 MED ORDER — GABAPENTIN 300 MG PO CAPS
300.0000 mg | ORAL_CAPSULE | ORAL | Status: AC
  Administered 2020-11-25: 300 mg via ORAL
  Filled 2020-11-25: qty 1

## 2020-11-25 MED ORDER — HEPARIN SODIUM (PORCINE) 5000 UNIT/ML IJ SOLN
5000.0000 [IU] | INTRAMUSCULAR | Status: AC
  Administered 2020-11-25: 5000 [IU] via SUBCUTANEOUS
  Filled 2020-11-25: qty 1

## 2020-11-25 MED ORDER — SODIUM CHLORIDE (PF) 0.9 % IJ SOLN
INTRAMUSCULAR | Status: DC | PRN
  Administered 2020-11-25: 10 mL

## 2020-11-25 SURGICAL SUPPLY — 67 items
ADH SKN CLS APL DERMABOND .7 (GAUZE/BANDAGES/DRESSINGS) ×2
APL PRP STRL LF DISP 70% ISPRP (MISCELLANEOUS) ×2
APPLIER CLIP 5 13 M/L LIGAMAX5 (MISCELLANEOUS)
APPLIER CLIP ROT 10 11.4 M/L (STAPLE)
APR CLP MED LRG 11.4X10 (STAPLE)
APR CLP MED LRG 5 ANG JAW (MISCELLANEOUS)
BLADE SURG 15 STRL LF DISP TIS (BLADE) ×2 IMPLANT
BLADE SURG 15 STRL SS (BLADE) ×3
CANNULA REDUC XI 12-8 STAPL (CANNULA) ×3
CANNULA REDUCER 12-8 DVNC XI (CANNULA) ×2 IMPLANT
CHLORAPREP W/TINT 26 (MISCELLANEOUS) ×3 IMPLANT
CLIP APPLIE 5 13 M/L LIGAMAX5 (MISCELLANEOUS) IMPLANT
CLIP APPLIE ROT 10 11.4 M/L (STAPLE) IMPLANT
COVER SURGICAL LIGHT HANDLE (MISCELLANEOUS) ×3 IMPLANT
DECANTER SPIKE VIAL GLASS SM (MISCELLANEOUS) ×3 IMPLANT
DERMABOND ADVANCED (GAUZE/BANDAGES/DRESSINGS) ×1
DERMABOND ADVANCED .7 DNX12 (GAUZE/BANDAGES/DRESSINGS) ×2 IMPLANT
DRAPE ARM DVNC X/XI (DISPOSABLE) ×8 IMPLANT
DRAPE COLUMN DVNC XI (DISPOSABLE) ×2 IMPLANT
DRAPE DA VINCI XI ARM (DISPOSABLE) ×12
DRAPE DA VINCI XI COLUMN (DISPOSABLE) ×3
ELECT REM PT RETURN 15FT ADLT (MISCELLANEOUS) ×3 IMPLANT
GLOVE SURG ENC MOIS LTX SZ8 (GLOVE) ×6 IMPLANT
GOWN STRL REUS W/TWL XL LVL3 (GOWN DISPOSABLE) ×9 IMPLANT
GRASPER SUT TROCAR 14GX15 (MISCELLANEOUS) ×3 IMPLANT
HOVERMATT SINGLE USE (MISCELLANEOUS) ×3 IMPLANT
IRRIG SUCT STRYKERFLOW 2 WTIP (MISCELLANEOUS) ×3
IRRIGATION SUCT STRKRFLW 2 WTP (MISCELLANEOUS) ×2 IMPLANT
KIT BASIN OR (CUSTOM PROCEDURE TRAY) ×3 IMPLANT
LUBRICANT JELLY K Y 4OZ (MISCELLANEOUS) IMPLANT
MARKER SKIN DUAL TIP RULER LAB (MISCELLANEOUS) ×3 IMPLANT
NEEDLE SPNL 22GX3.5 QUINCKE BK (NEEDLE) ×3 IMPLANT
OBTURATOR OPTICAL STANDARD 8MM (TROCAR) ×3
OBTURATOR OPTICAL STND 8 DVNC (TROCAR) ×2
OBTURATOR OPTICALSTD 8 DVNC (TROCAR) ×2 IMPLANT
PACK CARDIOVASCULAR III (CUSTOM PROCEDURE TRAY) ×3 IMPLANT
RELOAD STAPLE 60 3.5 BLU DVNC (STAPLE) IMPLANT
RELOAD STAPLER 2.5X60 WHT DVNC (STAPLE) ×10 IMPLANT
RELOAD STAPLER 3.5X60 BLU DVNC (STAPLE) ×2 IMPLANT
SCISSORS LAP 5X35 DISP (ENDOMECHANICALS) IMPLANT
SEAL CANN UNIV 5-8 DVNC XI (MISCELLANEOUS) ×6 IMPLANT
SEAL XI 5MM-8MM UNIVERSAL (MISCELLANEOUS) ×9
SEALER VESSEL DA VINCI XI (MISCELLANEOUS) ×3
SEALER VESSEL EXT DVNC XI (MISCELLANEOUS) ×2 IMPLANT
SLEEVE GASTRECTOMY 36FR VISIGI (MISCELLANEOUS) ×3 IMPLANT
SLEEVE GASTRECTOMY 40FR VISIGI (MISCELLANEOUS) IMPLANT
SOL ANTI FOG 6CC (MISCELLANEOUS) ×2 IMPLANT
SOLUTION ANTI FOG 6CC (MISCELLANEOUS) ×1
SOLUTION ELECTROLUBE (MISCELLANEOUS) ×3 IMPLANT
STAPLER 60 DA VINCI SURE FORM (STAPLE) ×3
STAPLER 60 SUREFORM DVNC (STAPLE) ×2 IMPLANT
STAPLER CANNULA SEAL DVNC XI (STAPLE) ×2 IMPLANT
STAPLER CANNULA SEAL XI (STAPLE) ×3
STAPLER RELOAD 2.5X60 WHITE (STAPLE) ×15
STAPLER RELOAD 2.5X60 WHT DVNC (STAPLE) ×10
STAPLER RELOAD 3.5X60 BLU DVNC (STAPLE) ×2
STAPLER RELOAD 3.5X60 BLUE (STAPLE) ×3
SUT ETHIBOND 0 36 GRN (SUTURE) ×3 IMPLANT
SUT MNCRL AB 4-0 PS2 18 (SUTURE) ×6 IMPLANT
SYR 10ML ECCENTRIC (SYRINGE) IMPLANT
SYR 20ML LL LF (SYRINGE) ×3 IMPLANT
TOWEL OR 17X26 10 PK STRL BLUE (TOWEL DISPOSABLE) ×3 IMPLANT
TRAY FOLEY MTR SLVR 16FR STAT (SET/KITS/TRAYS/PACK) IMPLANT
TROCAR ADV FIXATION 5X100MM (TROCAR) IMPLANT
TROCAR BLADELESS OPT 5 100 (ENDOMECHANICALS) ×3 IMPLANT
TUBE CALIBRATION LAPBAND (TUBING) ×3 IMPLANT
TUBING INSUFFLATION 10FT LAP (TUBING) ×3 IMPLANT

## 2020-11-25 NOTE — Discharge Instructions (Signed)

## 2020-11-25 NOTE — Transfer of Care (Signed)
Immediate Anesthesia Transfer of Care Note  Patient: [name]  Procedure(s) Performed: XI ROBOT ASSISTED GASTRIC SLEEVE RESECTION AND HIATAL HERNIA REPAIR (Abdomen) UPPER GI ENDOSCOPY  Patient Location: PACU  Anesthesia Type:General  Level of Consciousness: awake, alert , oriented and patient cooperative  Airway & Oxygen Therapy: Patient Spontanous Breathing and Patient connected to face mask oxygen  Post-op Assessment: Report given to RN, Post -op Vital signs reviewed and stable and Patient moving all extremities  Post vital signs: Reviewed and stable  Last Vitals:  Vitals Value Taken Time  BP 125/85 11/25/20 1309  Temp    Pulse 80 11/25/20 1311  Resp 13 11/25/20 1311  SpO2 96 % 11/25/20 1311  Vitals shown include unvalidated device data.  Last Pain:  Vitals:   11/25/20 0844  TempSrc:   PainSc: 0-No pain      Patients Stated Pain Goal: 3 ([id number])  Complications: No notable events documented.

## 2020-11-25 NOTE — Progress Notes (Signed)
PHARMACY CONSULT FOR:  Risk Assessment for Post-Discharge VTE Following Bariatric Surgery  Post-Discharge VTE Risk Assessment: This patient's probability of 30-day post-discharge VTE is increased due to the factors marked:   Female    Age >/=60 years    BMI >/=50 kg/m2    CHF    Dyspnea at Rest    Paraplegia   X Non-gastric-band surgery    Operation Time >/=3 hr    Return to OR     Length of Stay >/= 3 d   Hx of VTE   Hypercoagulable condition   Significant venous stasis    Predicted probability of 30-day post-discharge VTE: 0.16% (mild)  Other patient-specific factors to consider: N/A  Recommendation for Discharge: No pharmacologic prophylaxis post-discharge  [name] is a 50 y.o. female who underwent gastric sleeve resection and hiatal hernia repair on 11/25/20   Case start: 1055 Case end: 1303   No Known Allergies  Patient Measurements: Height: '5\' 8"'$  (172.7 cm) Weight: 112.1 kg (247 lb 3.2 oz) IBW/kg (Calculated) : 63.9 Body mass index is 37.59 kg/m.  No results for input(s): WBC, HGB, HCT, PLT, APTT, CREATININE, LABCREA, CREATININE, CREAT24HRUR, MG, PHOS, ALBUMIN, PROT, ALBUMIN, AST, ALT, ALKPHOS, BILITOT, BILIDIR, IBILI in the last 72 hours. Estimated Creatinine Clearance: 111.7 mL/min (by C-G formula based on SCr of 0.45 mg/dL).    Past Medical History:  Diagnosis Date   Complication of anesthesia    Elevated lipids    GERD (gastroesophageal reflux disease)    Obesity    PONV (postoperative nausea and vomiting)      Medications Prior to Admission  Medication Sig Dispense Refill Last Dose   Cyanocobalamin (VITAMIN B-12) 5000 MCG SUBL Place 5,000 mcg under the tongue once a week.   Past Month   pantoprazole (PROTONIX) 40 MG tablet Take 1 tablet (40 mg total) by mouth daily. (Patient taking differently: Take 40 mg by mouth 2 (two) times daily as needed (heartburn).) 30 tablet 3 Past Month   VITAMIN D PO Take 1 tablet by mouth daily.   Past Month     Dimple Nanas, PharmD 11/25/2020 3:15 PM

## 2020-11-25 NOTE — Interval H&P Note (Signed)
History and Physical Interval Note:  11/25/2020 9:44 AM  [name]  has presented today for surgery, with the diagnosis of MORBID OBESITY.  The various methods of treatment have been discussed with the patient and family. After consideration of risks, benefits and other options for treatment, the patient has consented to  Procedure(s): XI ROBOT ASSISTED GASTRIC SLEEVE RESECTION (N/A) UPPER GI ENDOSCOPY (N/A) as a surgical intervention.  The patient's history has been reviewed, patient examined, no change in status, stable for surgery.  I have reviewed the patient's chart and labs.  Questions were answered to the patient's satisfaction.     Pedro Earls

## 2020-11-25 NOTE — Anesthesia Postprocedure Evaluation (Signed)
Anesthesia Post Note  Patient: [name]  Procedure(s) Performed: XI ROBOT ASSISTED GASTRIC SLEEVE RESECTION AND HIATAL HERNIA REPAIR (Abdomen) UPPER GI ENDOSCOPY     Patient location during evaluation: PACU Anesthesia Type: General Level of consciousness: sedated and patient cooperative Pain management: pain level controlled Vital Signs Assessment: post-procedure vital signs reviewed and stable Respiratory status: spontaneous breathing Cardiovascular status: stable Anesthetic complications: no   No notable events documented.  Last Vitals:  Vitals:   11/25/20 1708 11/25/20 1754  BP: 131/77 131/75  Pulse: 76 78  Resp: 17 17  Temp: (!) 36.4 C 37 C  SpO2: 100% 97%    Last Pain:  Vitals:   11/25/20 1924  TempSrc:   PainSc: [place]

## 2020-11-25 NOTE — Interval H&P Note (Signed)
History and Physical Interval Note:  11/25/2020 9:43 AM  [name]  has presented today for surgery, with the diagnosis of MORBID OBESITY.  The various methods of treatment have been discussed with the patient and family. After consideration of risks, benefits and other options for treatment, the patient has consented to  Procedure(s): XI ROBOT ASSISTED GASTRIC SLEEVE RESECTION (N/A) UPPER GI ENDOSCOPY (N/A) as a surgical intervention.  The patient's history has been reviewed, patient examined, no change in status, stable for surgery.  I have reviewed the patient's chart and labs.  Questions were answered to the patient's satisfaction.     Pedro Earls

## 2020-11-25 NOTE — Op Note (Signed)
   Surgeon: Kaylyn Lim, MD, FACS  Asst:  Romana Juniper, MD, FACS 11/25/2020  Anes:  General endotracheal  Procedure: Robotic  posterior hiatal hernia repair (1 figure of 8) sleeve gastrectomy and upper endoscopy  Diagnosis: Morbid obesity  Complications: None noted  EBL:   minimal cc  Description of Procedure:  The patient was take to OR 2 and given general anesthesia.  The abdomen was prepped with Chloroprep and draped sterilely.  A timeout was performed.  Access to the abdomen was achieved with a 5 mm Optiview through the left upper quadrant.  Following insufflation, the state of the abdomen was found to be free of adhesions.  Four trocars were placed including a 12 mm for the robotic stapler.  The robot was docked.  The calibration tube went down and was positive and there was a slight visible hiatal hernia defect.  I performed a posterior dissection and place a 0 surgidek figure of 8.  This snugged down the hiatus.   The ViSiGi 36Fr tube was inserted to deflate the stomach and was pulled back into the esophagus.    The pylorus was identified and we measured 5 cm back and marked the antrum.  At that point we began dissection to take down the greater curvature of the stomach using the vessel sealer.  This dissection was taken all the way up to the left crus.  Posterior attachments of the stomach were also taken down.    The ViSiGi tube was then passed into the antrum and suction applied so that it was snug along the lessor curvature.  The "crow's foot" or incisura was identified.  The sleeve gastrectomy was begun using the Sureform platform stapler beginning with a blue load transitioning to white loads after the first firing.  When the sleeve was complete the tube was taken off suction and insufflated briefly.  The tube was withdrawn.  Upper endoscopy was then performed by Dr. Hassell Done and this was passed to the pylorus.  No bleeding or bubbles were seen. .     The specimen was extracted  through the 15 trocar site. This was closed with a 0 vicryl with the PRI.   Local block was provided by infiltrating abdomen as a TAP block and then closed 4-0 Monocryl and Dermabond.    Matt B. Hassell Done, [place], [facility], [place]

## 2020-11-25 NOTE — Anesthesia Preprocedure Evaluation (Addendum)
Anesthesia Evaluation  Patient identified by MRN, date of birth, ID band Patient awake    Reviewed: Allergy & Precautions, NPO status , Patient's Chart, lab work & pertinent test results  History of Anesthesia Complications (+) PONV and history of anesthetic complications  Airway Mallampati: III  TM Distance: >3 FB Neck ROM: Full    Dental  (+) Dental Advisory Given, Teeth Intact   Pulmonary asthma , former smoker,    Pulmonary exam normal breath sounds clear to auscultation       Cardiovascular negative cardio ROS Normal cardiovascular exam Rhythm:Regular Rate:Normal     Neuro/Psych negative neurological ROS     GI/Hepatic Neg liver ROS, GERD  ,  Endo/Other  negative endocrine ROS  Renal/GU negative Renal ROS     Musculoskeletal  (+) Arthritis ,   Abdominal (+) + obese,   Peds  Hematology negative hematology ROS (+)   Anesthesia Other Findings   Reproductive/Obstetrics                            Anesthesia Physical Anesthesia Plan  ASA: 3  Anesthesia Plan: General   Post-op Pain Management:    Induction: Intravenous  PONV Risk Score and Plan: 4 or greater and Ondansetron, Aprepitant, Dexamethasone, Midazolam, Diphenhydramine and Treatment may vary due to age or medical condition  Airway Management Planned: Oral ETT  Additional Equipment: None  Intra-op Plan:   Post-operative Plan: Extubation in OR  Informed Consent: I have reviewed the patients History and Physical, chart, labs and discussed the procedure including the risks, benefits and alternatives for the proposed anesthesia with the patient or authorized representative who has indicated his/her understanding and acceptance.     Dental advisory given  Plan Discussed with: CRNA  Anesthesia Plan Comments:        Anesthesia Quick Evaluation

## 2020-11-25 NOTE — Progress Notes (Signed)

## 2020-11-25 NOTE — Anesthesia Procedure Notes (Addendum)
Procedure Name: Intubation Date/Time: 11/25/2020 10:31 AM Performed by: Myna Bright, CRNA Pre-anesthesia Checklist: Patient identified, Emergency Drugs available, Suction available and Patient being monitored Patient Re-evaluated:Patient Re-evaluated prior to induction Oxygen Delivery Method: Circle system utilized Preoxygenation: Pre-oxygenation with 100% oxygen Induction Type: IV induction Ventilation: Mask ventilation without difficulty Laryngoscope Size: Glidescope and 3 Grade View: Grade III Tube type: Oral Tube size: 7.0 mm Number of attempts: 3 Airway Equipment and Method: Stylet and Video-laryngoscopy Placement Confirmation: ETT inserted through vocal cords under direct vision, positive ETCO2 and breath sounds checked- equal and bilateral Secured at: 21 cm Tube secured with: Tape Dental Injury: Teeth and Oropharynx as per pre-operative assessment  Difficulty Due To: Difficulty was anticipated and Difficult Airway- due to anterior larynx Future Recommendations: Recommend- induction with short-acting agent, and alternative techniques readily available Comments: Easy mask airway with OAW in place. DL with MAC 3 blade by paramedic student, Herbie Baltimore. No view obtained. DL x 1 with MAC 3 by CRNA, view of epiglottis only, unable to obtain view of cords. Resumed mask ventilation. DL with Glidescope by parametic student with partial view of cords, very anterior. Melia Hopes took over. Atraumatic oral intubation. Recommend Glidescope in future.

## 2020-11-26 ENCOUNTER — Other Ambulatory Visit (HOSPITAL_COMMUNITY): Payer: Self-pay

## 2020-11-26 ENCOUNTER — Encounter (HOSPITAL_COMMUNITY): Payer: Self-pay | Admitting: Surgery

## 2020-11-26 LAB — CBC WITH DIFFERENTIAL/PLATELET
Abs Immature Granulocytes: 0.05 10*3/uL (ref 0.00–0.07)
Basophils Absolute: 0 10*3/uL (ref 0.0–0.1)
Basophils Relative: 0 %
Eosinophils Absolute: 0 10*3/uL (ref 0.0–0.5)
Eosinophils Relative: 0 %
HCT: 41.9 % (ref 36.0–46.0)
Hemoglobin: 13.4 g/dL (ref 12.0–15.0)
Immature Granulocytes: 1 %
Lymphocytes Relative: 8 %
Lymphs Abs: 0.9 10*3/uL (ref 0.7–4.0)
MCH: 29.1 pg (ref 26.0–34.0)
MCHC: 32 g/dL (ref 30.0–36.0)
MCV: 90.9 fL (ref 80.0–100.0)
Monocytes Absolute: 0.4 10*3/uL (ref 0.1–1.0)
Monocytes Relative: 4 %
Neutro Abs: 8.9 10*3/uL — ABNORMAL HIGH (ref 1.7–7.7)
Neutrophils Relative %: 87 %
Platelets: 240 10*3/uL (ref 150–400)
RBC: 4.61 MIL/uL (ref 3.87–5.11)
RDW: 12.3 % (ref 11.5–15.5)
WBC: 10.2 10*3/uL (ref 4.0–10.5)
nRBC: 0 % (ref 0.0–0.2)

## 2020-11-26 MED ORDER — ONDANSETRON 4 MG PO TBDP
4.0000 mg | ORAL_TABLET | Freq: Four times a day (QID) | ORAL | 0 refills | Status: DC | PRN
  Filled 2020-11-26: qty 18, 21d supply, fill #0

## 2020-11-26 MED ORDER — PANTOPRAZOLE SODIUM 40 MG PO TBEC
40.0000 mg | DELAYED_RELEASE_TABLET | Freq: Every day | ORAL | 0 refills | Status: DC
  Filled 2020-11-26: qty 60, 60d supply, fill #0
  Filled 2021-11-11: qty 30, 30d supply, fill #1

## 2020-11-26 MED ORDER — OXYCODONE HCL 5 MG PO TABS
5.0000 mg | ORAL_TABLET | Freq: Four times a day (QID) | ORAL | 0 refills | Status: DC | PRN
  Filled 2020-11-26: qty 10, 3d supply, fill #0

## 2020-11-26 NOTE — Progress Notes (Signed)
24hr fluid recall prior to discharge: 545m.  Per dehydration protocol, will call pt to f/u within one week post op.

## 2020-11-26 NOTE — Progress Notes (Signed)
Patient was given discharge instructions by Crystal, and all questions were answered.  Patient was stable for discharge and was taken to the main exit by wheelchair. 

## 2020-11-26 NOTE — Discharge Summary (Signed)
Physician Discharge Summary  Patient ID: [name] MRN: [id number] DOB/AGE: Aug 20, 1970 50 y.o.  PCP: Pcp, No  Admit date: 11/25/2020 Discharge date: 11/26/2020  Admission Diagnoses:  obesity  Discharge Diagnoses:  same  Active Problems:   Status post sleeve gastrectomy   Surgery:  robotic sleeve gastrectomy with posterior hiatal hernia repair  Discharged Condition: improved   Hospital Course:   had surgery on Monday.  Started on clears and advanced to full liquids and ready for discharge on Tuesday  Consults: none  Significant Diagnostic Studies: none    Discharge Exam: Blood pressure 101/70, pulse 72, temperature 98.6 F (37 C), resp. rate 17, height '5\' 8"'$  (1.727 m), weight 112.1 kg, SpO2 98 %. Incisions OK  Disposition: Discharge disposition: 01-Home or Self Care       Discharge Instructions     Ambulate hourly while awake   Complete by: As directed    Call MD for:  difficulty breathing, headache or visual disturbances   Complete by: As directed    Call MD for:  persistant dizziness or light-headedness   Complete by: As directed    Call MD for:  persistant nausea and vomiting   Complete by: As directed    Call MD for:  redness, tenderness, or signs of infection (pain, swelling, redness, odor or green/yellow discharge around incision site)   Complete by: As directed    Call MD for:  severe uncontrolled pain   Complete by: As directed    Call MD for:  temperature >101 F   Complete by: As directed    Diet bariatric full liquid   Complete by: As directed    Incentive spirometry   Complete by: As directed    Perform hourly while awake      Allergies as of 11/26/2020   No Known Allergies      Medication List     TAKE these medications    ondansetron 4 MG disintegrating tablet Commonly known as: ZOFRAN-ODT Take 1 tablet (4 mg total) by mouth every 6 (six) hours as needed for nausea or vomiting.   oxyCODONE 5 MG immediate release  tablet Commonly known as: Oxy IR/ROXICODONE Take 1 tablet (5 mg total) by mouth every 6 (six) hours as needed for severe pain.   pantoprazole 40 MG tablet Commonly known as: PROTONIX Take 1 tablet (40 mg total) by mouth daily. What changed:  when to take this reasons to take this   pantoprazole 40 MG tablet Commonly known as: PROTONIX Take 1 tablet (40 mg total) by mouth daily. What changed: You were already taking a medication with the same name, and this prescription was added. Make sure you understand how and when to take each.   Vitamin B-12 5000 MCG Subl Place 5,000 mcg under the tongue once a week.   VITAMIN D PO Take 1 tablet by mouth daily.        Follow-up Information     Surgery, [place]. Go on 12/18/2020.   Specialty: General Surgery Why: at 9:15am with Dr. Hassell Done.  Please arrive 15 minutes prior to your appointment time.  Thank you. Contact information: [address] [place] [address] [phone]         Carlena Hurl, PA-C. Go on 01/17/2021.   Specialty: General Surgery Why: at 2pm for Dr. Hassell Done.  Please arrive 15 minutes prior to your appointment time. Thank you. Contact information: [address] [place] [address] [phone]  Signed: Pedro Earls 11/26/2020, 10:12 AM

## 2020-11-26 NOTE — Progress Notes (Signed)
Patient alert and oriented, pain is controlled. Patient is tolerating fluids, advanced to protein shake today, patient is tolerating well.  Reviewed Gastric sleeve discharge instructions with patient and patient is able to articulate understanding.  Provided information on BELT program, Support Group and [facility] outpatient pharmacy. All questions answered, will continue to monitor.  

## 2020-11-26 NOTE — Progress Notes (Signed)
Nutrition Education Note ° °Received consult for diet education for patient s/p bariatric surgery. ° °Discussed 2 week post op diet with pt. Emphasized that liquids must be non carbonated, non caffeinated, and sugar free. Fluid goals discussed. Pt to follow up with outpatient bariatric RD for further diet progression after 2 weeks. Multivitamins and minerals also reviewed. Teach back method used, pt expressed understanding, expect good compliance. ° °If nutrition issues arise, please consult RD. ° °Laura Kidd, MS, RD, LDN °Inpatient Clinical Dietitian °Contact information available via Amion ° ° °

## 2020-11-29 ENCOUNTER — Telehealth (HOSPITAL_COMMUNITY): Payer: Self-pay | Admitting: *Deleted

## 2020-11-29 LAB — SURGICAL PATHOLOGY

## 2020-11-29 NOTE — Telephone Encounter (Signed)
1.  Tell me about your pain and pain management? Pt denies any abdominal pain, just soreness with movement that's tolerable. Pt c/o sore throat, saying it's getting better, but still has some discomfort.  Discussed gargling with warm salt water and a few sugar free cough drops needed.  2.  Let's talk about fluid intake.  How much total fluid are you taking in? Pt states that she is getting in at least 64oz of fluid including protein shakes, bottled water, jello and cream soups. Pt instructed to assess status and suggestions daily utilizing Hydration Action Plan on discharge folder and to call [facility] if in the "red zone".   3.  How much protein have you taken in the last 2 days? Pt states she is meeting her goal of 60g of protein each day with the protein shakes.  4.  Have you had nausea?  Tell me about when have experienced nausea and what you did to help? Pt denies nausea.   5.  Has the frequency or color changed with your urine? Pt states that she is urinating "fine" with no changes in frequency or urgency.     6.  Tell me what your incisions look like? "Incisions look fine". Pt denies a fever, chills.  Pt states incisions are not swollen, open, or draining.  Pt encouraged to call [facility] if incisions change.   7.  Have you been passing gas? BM? Pt states that she is having BMs. Last BM 11/28/20.  8.  If a problem or question were to arise who would you call?  Do you know contact numbers for [place], [facility], and [facility]? Pt denies dehydration symptoms.  Pt can describe s/sx of dehydration.  Pt knows to call [facility] for surgical, [facility] for nutrition, and [place] for non-urgent questions or concerns.   9.  How has the walking going? Pt states she is walking around and able to be active without difficulty. Pt states that she has been able to take walks outside.   10. Are you still using your incentive spirometer?  If so, how often? Pt denies using I.S. Pt encouraged to use incentive spirometer, at least 10x every  hour while awake until she sees the surgeon.  11.  How are your vitamins and calcium going?  How are you taking them? Pt states that she is taking her supplements and vitamins.  Pt states that multivitamins make her nausea.  Encouraged pt to use nausea medication if needed to ensure she can take her vitamins.     Reminded patient that the first 30 days post-operatively are important for successful recovery.  Practice good hand hygiene, wearing a mask when appropriate (since optional in most places), and minimizing exposure to people who live outside of the home, especially if they are exhibiting any respiratory, GI, or illness-like symptoms.

## 2020-12-05 ENCOUNTER — Encounter: Payer: Self-pay | Admitting: Dietician

## 2020-12-05 ENCOUNTER — Other Ambulatory Visit: Payer: Self-pay

## 2020-12-05 ENCOUNTER — Ambulatory Visit: Payer: Self-pay | Admitting: Physician Assistant

## 2020-12-05 VITALS — BP 100/70 | HR 109 | Temp 97.6°F | Resp 14 | Ht 68.0 in | Wt 235.0 lb

## 2020-12-05 DIAGNOSIS — B354 Tinea corporis: Secondary | ICD-10-CM

## 2020-12-05 MED ORDER — CLOTRIMAZOLE-BETAMETHASONE 1-0.05 % EX CREA: TOPICAL_CREAM | CUTANEOUS | 1 refills | Status: DC

## 2020-12-05 MED ORDER — HYDROXYZINE PAMOATE 25 MG PO CAPS: 25.0000 mg | ORAL_CAPSULE | Freq: Three times a day (TID) | ORAL | 0 refills | Status: DC | PRN

## 2020-12-05 NOTE — Progress Notes (Signed)
Patient called reporting hunger and asking for appropriate protein sources she can try, now 10 days after sleeve gastrectomy. Advised very small amounts of lean, tender, soft proteins ie low fat cheese, lean ground meat, grilled or baked fish. She has tried scrambled egg without issue.  She will be attending in-person MNT visit on 12/09/20.

## 2020-12-05 NOTE — Progress Notes (Signed)
Pt has had rash on underwear line going on 2 weeks. CL,RMA

## 2020-12-05 NOTE — Progress Notes (Signed)
[facility] Emergency Department Provider Note   ____________________________________________   None    (approximate)  I have reviewed the triage vital signs and the nursing notes.   HISTORY  Chief Complaint No chief complaint on file.    HPI [name] is a 50 y.o. female patient complain of itchy rash bilateral medial thigh for area for 3 weeks.  Rash is resistant to over-the-counter antifungal medications.  Recent gastric bypass surgery.         Patient Active Problem List   Diagnosis Date Noted   Status post sleeve gastrectomy 11/25/2020   Mild intermittent asthma without complication [id number]   Primary osteoarthritis involving multiple joints 08/21/2019   Class 2 obesity with body mass index (BMI) of 39.0 to 39.9 in adult 03/06/2019   Pain in joint, shoulder region 12/20/2018   Gastroesophageal reflux disease 02/15/2017   S/P laparoscopic cholecystectomy 09/11/2015   Hyperplastic colon polyp 08/30/2015    Past Surgical History:  Procedure Laterality Date   ABDOMINAL HYSTERECTOMY     APPENDECTOMY     CHOLECYSTECTOMY     colonoscopy  2018 or 2019   [place]  2018 or 2019   UPPER GI ENDOSCOPY N/A 11/25/2020   Procedure: UPPER GI ENDOSCOPY;  Surgeon: Johnathan Hausen, MD;  Location: [facility] ORS;  Service: General;  Laterality: N/A;    Prior to Admission medications   Medication Sig Start Date End Date Taking? Authorizing Provider  clotrimazole-betamethasone (LOTRISONE) cream Apply to affected area 2 times daily 12/05/20  Yes Sable Feil, PA-C  Cyanocobalamin (VITAMIN B-12) 5000 MCG SUBL Place 5,000 mcg under the tongue once a week.   Yes [provider]  hydrOXYzine (VISTARIL) 25 MG capsule Take 1 capsule (25 mg total) by mouth every 8 (eight) hours as needed. 12/05/20  Yes Sable Feil, PA-C  ondansetron (ZOFRAN-ODT) 4 MG disintegrating tablet Take 1 tablet (4 mg total) by mouth  every 6 (six) hours as needed for nausea or vomiting. 11/26/20  Yes Johnathan Hausen, MD  oxyCODONE (OXY IR/ROXICODONE) 5 MG immediate release tablet Take 1 tablet (5 mg total) by mouth every 6 (six) hours as needed for severe pain. 11/26/20  Yes Johnathan Hausen, MD  pantoprazole (PROTONIX) 40 MG tablet Take 1 tablet (40 mg total) by mouth daily. 11/26/20  Yes Johnathan Hausen, MD  VITAMIN D PO Take 1 tablet by mouth daily.   Yes [provider]    Allergies Patient has no known allergies.  Family History  Problem Relation Age of Onset   Breast cancer Maternal Aunt    Diabetes Mother    Diabetes Maternal Grandfather    Diabetes Maternal Grandmother    Diabetes Paternal Grandmother     Social History Social History   Tobacco Use   Smoking status: Former    Packs/day: 1.00    Types: Cigarettes    Quit date: 04/2018    Years since quitting: 2.6   Smokeless tobacco: Never  Vaping Use   Vaping Use: Never used  Substance Use Topics   Alcohol use: Yes    Alcohol/week: 0.0 - 2.0 standard drinks   Drug use: Never    Review of Systems  Constitutional: No fever/chills Eyes: No visual changes. ENT: No sore throat. Cardiovascular: Denies chest pain. Respiratory: Denies shortness of breath. Gastrointestinal: No abdominal pain.  No nausea, no vomiting.  No diarrhea.  No constipation.  GERD. Genitourinary: Negative for dysuria. Musculoskeletal: Negative for  back pain. Skin positive for rash. Neurological: Negative for headaches, focal weakness or numbness.   ____________________________________________   PHYSICAL EXAM:  VITAL SIGNS: Constitutional: Alert and oriented. Well appearing and in no acute distress. Eyes: Conjunctivae are normal. PERRL. EOMI. Head: Atraumatic. Nose: No congestion/rhinnorhea. Mouth/Throat: Mucous membranes are moist.  Oropharynx non-erythematous. Neck: No stridor.  No cervical spine tenderness to palpation. Hematological/Lymphatic/Immunilogical:  No cervical lymphadenopathy. Cardiovascular: Normal rate, regular rhythm. Grossly normal heart sounds.  Good peripheral circulation. Respiratory: Normal respiratory effort.  No retractions. Lungs CTAB. Gastrointestinal: Soft and nontender. No distention. No abdominal bruits. No CVA tenderness. Genitourinary: Deferred Musculoskeletal: No lower extremity tenderness nor edema.  No joint effusions. Neurologic:  Normal speech and language. No gross focal neurologic deficits are appreciated. No gait instability. Skin:  Skin is warm, dry and intact.  Abdominal surgical scars consistent recent history.  Erythematous macular lesions bilateral medial thigh fold area.   Psychiatric: Mood and affect are normal. Speech and behavior are normal.  ____________________________________________   LABS (all labs ordered are listed, but only abnormal results are displayed)  '@EDLABS'$ @ ____________________________________________  EKG   ____________________________________________  RADIOLOGY I, Sable Feil, personally viewed and evaluated these images (plain radiographs) as part of my medical decision making, as well as reviewing the written report by the radiologist.  ED MD interpretation:    Official radiology report(s): No results found.  ____________________________________________   PROCEDURES  Procedure(s) performed (including Critical Care):  Procedures   ____________________________________________   INITIAL IMPRESSION / ASSESSMENT AND PLAN / ED COURSE  As part of my medical decision making, I reviewed the following data within the [place]          Patient complaining physical exam consistent with tinea corporis.  Patient given discharge care instructions and advised take medication as directed.   ____________________________________________   FINAL CLINICAL IMPRESSION(S) / ED DIAGNOSES  Tinea Corporis.   ED Discharge Orders          Ordered     clotrimazole-betamethasone (LOTRISONE) cream        12/05/20 0926    hydrOXYzine (VISTARIL) 25 MG capsule  Every 8 hours PRN        12/05/20 0926             Note:  This document was prepared using Dragon voice recognition software and may include unintentional dictation errors.

## 2020-12-06 ENCOUNTER — Encounter: Payer: Self-pay | Admitting: Physician Assistant

## 2020-12-09 ENCOUNTER — Encounter: Payer: Self-pay | Admitting: Dietician

## 2020-12-09 ENCOUNTER — Encounter: Payer: 59 | Attending: Surgery | Admitting: Dietician

## 2020-12-09 ENCOUNTER — Other Ambulatory Visit: Payer: Self-pay

## 2020-12-09 VITALS — Ht 68.0 in | Wt 233.9 lb

## 2020-12-09 DIAGNOSIS — E669 Obesity, unspecified: Secondary | ICD-10-CM | POA: Diagnosis not present

## 2020-12-09 DIAGNOSIS — Z6835 Body mass index (BMI) 35.0-35.9, adult: Secondary | ICD-10-CM | POA: Diagnosis not present

## 2020-12-09 NOTE — Patient Instructions (Signed)
Continue to add solid protein foods and gradually reduce protein shakes as able to increase the amount of solid protein.  Monitor protein and fluid intake; baritastic or other app such as MyFitnessPal can help.

## 2020-12-09 NOTE — Progress Notes (Signed)
Nutrition Therapy for Post-Operative Bariatric Diet Follow-up visit:  2 weeks post-op sleeve gastrectomy surgery  Medical Nutrition Therapy:  Appt start time: 1630 end time:  1715  Anthropometrics:  Date 12/09/20  BMI 35.56  Weight (lbs) 233.9  Skeletal muscle (lbs) 51.6  % body fat 50.4   Clinical: Medications: pantoprazole Supplementation: bariatric multivitamin 2x daily + calcium citrate 3x daily  Health/ medical history changes: none GI symptoms: N/V/D/C: mild constipation, has protein shake with fiber, some beans Dumping Syndrome: no Hair loss: no  Dietary/ Lifestyle Progress: Patient reports no issues with pain, nausea.  She has felt hunger symptoms on liquid diet and has started a few soft solid foods shrimp, egg, [place] chili with beans, refried beans. This has helped alleviate hunger symptoms without adverse effects. 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  Advised allowing up to 30 minutes to eat a meal, then waiting 2-3 hours before next meal or snack.  Discussed and illustrated  with food models 1-2oz portions of protein foods. Advised inclusion of low carb vegetables only when able to consistently meet daily protein goal with solid foods.  Nutritional Diagnosis:  [place]-3.3 Overweight/obesity As related to history of excess calories and inadequate physical activity.  As evidenced by patient with current BMI of 35.56, following bariatric diet guidelines for ongoing weight loss after sleeve gastrectomy.  Teaching Method Utilized:  Visual Auditory Hands on  Materials provided: Phase 3 and 4 bariatric diet handouts Visit summary with goals to be viewed via [place]:  Change in progress  Barriers to learning/adherence to lifestyle change: none  Demonstrated degree of understanding via:  Teach Back      Plan: Return for MNT follow up on 02/05/21  at 4:00pm

## 2020-12-10 ENCOUNTER — Ambulatory Visit: Payer: Self-pay

## 2020-12-10 VITALS — BP 110/68 | HR 83

## 2020-12-10 DIAGNOSIS — Z0130 Encounter for examination of blood pressure without abnormal findings: Secondary | ICD-10-CM

## 2020-12-10 NOTE — Progress Notes (Signed)
Pt presents today for BP check. CL,RMA 

## 2021-02-05 ENCOUNTER — Ambulatory Visit: Payer: 59 | Admitting: Dietician

## 2021-02-06 ENCOUNTER — Other Ambulatory Visit: Payer: Self-pay

## 2021-02-06 ENCOUNTER — Ambulatory Visit: Payer: Self-pay | Admitting: Physician Assistant

## 2021-02-06 ENCOUNTER — Encounter: Payer: Self-pay | Admitting: Physician Assistant

## 2021-02-06 VITALS — BP 123/77 | HR 100 | Temp 97.7°F | Resp 14 | Ht 67.0 in | Wt 223.0 lb

## 2021-02-06 DIAGNOSIS — M436 Torticollis: Secondary | ICD-10-CM

## 2021-02-06 MED ORDER — NAPROXEN 500 MG PO TABS: 500.0000 mg | ORAL_TABLET | Freq: Two times a day (BID) | ORAL | Status: DC

## 2021-02-06 MED ORDER — ORPHENADRINE CITRATE ER 100 MG PO TB12: 100.0000 mg | ORAL_TABLET | Freq: Two times a day (BID) | ORAL | 0 refills | Status: DC

## 2021-02-06 NOTE — Progress Notes (Signed)
   Subjective: Left lateral neck pain    Patient ID: [name], female    DOB: Sep 15, 1970, 50 y.o.   MRN: [id number]  HPI Patient complain of left lateral neck pain for 1 week upon a.m. awakening.  Patient states pain radiates to the top of her left shoulder.  Patient described the pain as "tight".  Patient went to chiropractor yesterday and felt relief after manipulation.  Awaking this morning with return of pain.  Patient state pain increased with neck movements and shrugging of the shoulders.   Review of Systems GERD    Objective:   Physical Exam Moderate distress.  Temperature 97.7, pulse 100, respiration 14, BP is 122/77, and patient 96 percent O2 sat on room air.  Patient weighs 223 pounds and BMI is 34.93. Patient is maintained neck in a neutral position.  Decreased range of motion is all fields.  Decreased range of motion with shoulder shrug.  Neurovascular intact of the left upper extremity.       Assessment & Plan: Torticollis   Patient given discharge care instructions and advised take medication as directed.  Patient advised to follow-up in 5 days.  Sooner if condition worsens.

## 2021-02-06 NOTE — Progress Notes (Signed)
Pt goes to chiropractor for the past five weeks, once a week./CL,RMA

## 2021-02-18 ENCOUNTER — Encounter: Payer: Self-pay | Admitting: Physician Assistant

## 2021-02-18 ENCOUNTER — Ambulatory Visit: Payer: Self-pay | Admitting: Physician Assistant

## 2021-02-18 ENCOUNTER — Ambulatory Visit
Admission: RE | Admit: 2021-02-18 | Discharge: 2021-02-18 | Disposition: A | Discharge status: Home / Self Care | Payer: 59 | Source: Ambulatory Visit | Attending: Physician Assistant | Admitting: Physician Assistant

## 2021-02-18 ENCOUNTER — Ambulatory Visit
Admission: RE | Admit: 2021-02-18 | Discharge: 2021-02-18 | Disposition: A | Discharge status: Home / Self Care | Payer: 59 | Attending: Physician Assistant | Admitting: Physician Assistant

## 2021-02-18 ENCOUNTER — Other Ambulatory Visit: Payer: Self-pay

## 2021-02-18 ENCOUNTER — Ambulatory Visit: Payer: 59 | Admitting: Physician Assistant

## 2021-02-18 VITALS — BP 113/76 | HR 93 | Temp 97.7°F | Resp 14 | Ht 68.0 in | Wt 221.0 lb

## 2021-02-18 DIAGNOSIS — M2550 Pain in unspecified joint: Secondary | ICD-10-CM

## 2021-02-18 DIAGNOSIS — M542 Cervicalgia: Secondary | ICD-10-CM | POA: Diagnosis not present

## 2021-02-18 NOTE — Progress Notes (Signed)
Pt presents today for follow up on left side of neck. Pain hasn't gotten no better. She had chiropractor apt today and it feels slightly better but will go back to the same pain the next day.

## 2021-02-18 NOTE — Progress Notes (Signed)
   Subjective: Neck pain    Patient ID: [name], female    DOB: 23-Mar-1971, 50 y.o.   MRN: [id number]  HPI Patient here today for follow-up 12 days neck pain.  Patient state only mild relief with medications consistent muscle relaxants and NSAIDs.  Patient states moderate transient relief with adjustment from chiropractor.  Patient states there is radicular component down to the bilateral shoulder.  Patient has a family history of arthritis of her siblings and parents.  Patient states she has noticed increasing pain in the joints although the neck is the worst.   Review of Systems GERD and obesity.    Objective:   Physical Exam No acute distress.  Temperature 97.7, pulse 93, respiration 14, BP is 113/76, patient 99% O2 sat on room air.  Patient weighs 221 pounds and BMI is 33.60.  Patient neck position slightly right lateral as she moves with whole body instead of just moving the neck.  Patient has moderate guarding palpation of L4-L6.  Decreased range of motion is all fields.       Assessment & Plan: Neck pain   Patient sent for outpatient images of the cervical spine.  Patient also will get a RA, ANA, sed rate drawn today.  Follow-up post images and labs.

## 2021-02-19 LAB — RHEUMATOID FACTOR: Rheumatoid fact SerPl-aCnc: 11.2 IU/mL (ref <14.0)

## 2021-02-19 LAB — SEDIMENTATION RATE: Sed Rate: 23 mm/hr (ref 0–40)

## 2021-02-19 LAB — ANA: Anti Nuclear Antibody (ANA): NEGATIVE

## 2021-02-20 ENCOUNTER — Ambulatory Visit: Payer: Self-pay | Admitting: Physician Assistant

## 2021-02-20 ENCOUNTER — Encounter: Payer: Self-pay | Admitting: Physician Assistant

## 2021-02-20 ENCOUNTER — Other Ambulatory Visit: Payer: Self-pay

## 2021-02-20 VITALS — BP 132/69 | HR 50 | Temp 97.5°F | Resp 14 | Wt 220.0 lb

## 2021-02-20 DIAGNOSIS — M4712 Other spondylosis with myelopathy, cervical region: Secondary | ICD-10-CM

## 2021-02-20 NOTE — Progress Notes (Signed)
   Subjective: Left lateral neck pain    Patient ID: [name], female    DOB: 1971/03/19, 50 y.o.   MRN: [id number]  HPI Patient presents for reevaluation of left lateral neck pain for 2 weeks.  Patient states mild transient relief once a week of chiropractor manipulation.  Initial complaint was thought to be torticollis since patient stated she woke up with this pain 2 weeks ago.  Patient is refractory to anti-inflammatory medication and muscle relaxers.  Patient had x-ray of the cervical spine showing facet hypertrophy at C4 and C5.  Patient also has mild left bony neuroforaminal narrowing at C3 and C4.   Review of Systems GERD    Objective:   Physical Exam  Mild distress.  Temperature 97.5, pulse 50, respiration 14, BP is 132/69, patient 99% O2 sat on room air.  Patient weighs 220 pounds and BMI is 33.47.  Patient continues to hold his neck and is slightly right flexed position.  Patient continues to massage the left lateral aspect of the neck.       Assessment & Plan: Mild degenerative changes cervical spine   Discussed x-ray findings with patient.  Patient  consulted to orthopedics for definitive evaluation and treatment.

## 2021-02-20 NOTE — Addendum Note (Signed)
Addended by: Aliene Altes on: 02/20/2021 04:59 PM   Modules accepted: Orders

## 2021-03-19 DIAGNOSIS — M62838 Other muscle spasm: Secondary | ICD-10-CM | POA: Diagnosis not present

## 2021-03-19 DIAGNOSIS — M436 Torticollis: Secondary | ICD-10-CM | POA: Diagnosis not present

## 2021-03-19 DIAGNOSIS — M47812 Spondylosis without myelopathy or radiculopathy, cervical region: Secondary | ICD-10-CM | POA: Diagnosis not present

## 2021-03-24 ENCOUNTER — Other Ambulatory Visit: Payer: Self-pay

## 2021-03-24 ENCOUNTER — Emergency Department: Payer: 59

## 2021-03-24 ENCOUNTER — Emergency Department
Admission: EM | Admit: 2021-03-24 | Discharge: 2021-03-24 | Disposition: A | Discharge status: Home / Self Care | Payer: 59 | Attending: Emergency Medicine | Admitting: Emergency Medicine

## 2021-03-24 DIAGNOSIS — R519 Headache, unspecified: Secondary | ICD-10-CM | POA: Diagnosis not present

## 2021-03-24 DIAGNOSIS — Z87891 Personal history of nicotine dependence: Secondary | ICD-10-CM | POA: Insufficient documentation

## 2021-03-24 DIAGNOSIS — R200 Anesthesia of skin: Secondary | ICD-10-CM

## 2021-03-24 DIAGNOSIS — J4520 Mild intermittent asthma, uncomplicated: Secondary | ICD-10-CM | POA: Diagnosis not present

## 2021-03-24 DIAGNOSIS — R791 Abnormal coagulation profile: Secondary | ICD-10-CM | POA: Diagnosis not present

## 2021-03-24 DIAGNOSIS — R202 Paresthesia of skin: Secondary | ICD-10-CM | POA: Insufficient documentation

## 2021-03-24 LAB — CBC
HCT: 47.9 % — ABNORMAL HIGH (ref 36.0–46.0)
Hemoglobin: 15.7 g/dL — ABNORMAL HIGH (ref 12.0–15.0)
MCH: 29.3 pg (ref 26.0–34.0)
MCHC: 32.8 g/dL (ref 30.0–36.0)
MCV: 89.5 fL (ref 80.0–100.0)
Platelets: 231 10*3/uL (ref 150–400)
RBC: 5.35 MIL/uL — ABNORMAL HIGH (ref 3.87–5.11)
RDW: 12.7 % (ref 11.5–15.5)
WBC: 7.7 10*3/uL (ref 4.0–10.5)
nRBC: 0 % (ref 0.0–0.2)

## 2021-03-24 LAB — COMPREHENSIVE METABOLIC PANEL
ALT: 15 U/L (ref 0–44)
AST: 33 U/L (ref 15–41)
Albumin: 4.4 g/dL (ref 3.5–5.0)
Alkaline Phosphatase: 74 U/L (ref 38–126)
Anion gap: 6 (ref 5–15)
BUN: 14 mg/dL (ref 6–20)
CO2: 28 mmol/L (ref 22–32)
Calcium: 9.2 mg/dL (ref 8.9–10.3)
Chloride: 105 mmol/L (ref 98–111)
Creatinine, Ser: 0.56 mg/dL (ref 0.44–1.00)
GFR, Estimated: >60 mL/min (ref >60)
Glucose, Bld: 105 mg/dL — ABNORMAL HIGH (ref 70–99)
Potassium: 4 mmol/L (ref 3.5–5.1)
Sodium: 139 mmol/L (ref 135–145)
Total Bilirubin: 1.1 mg/dL (ref 0.3–1.2)
Total Protein: 7.8 g/dL (ref 6.5–8.1)

## 2021-03-24 LAB — DIFFERENTIAL
Abs Immature Granulocytes: 0.02 10*3/uL (ref 0.00–0.07)
Basophils Absolute: 0.1 10*3/uL (ref 0.0–0.1)
Basophils Relative: 1 %
Eosinophils Absolute: 0.2 10*3/uL (ref 0.0–0.5)
Eosinophils Relative: 3 %
Immature Granulocytes: 0 %
Lymphocytes Relative: 29 %
Lymphs Abs: 2.2 10*3/uL (ref 0.7–4.0)
Monocytes Absolute: 0.6 10*3/uL (ref 0.1–1.0)
Monocytes Relative: 8 %
Neutro Abs: 4.5 10*3/uL (ref 1.7–7.7)
Neutrophils Relative %: 59 %

## 2021-03-24 LAB — APTT: aPTT: 34 seconds (ref 24–36)

## 2021-03-24 LAB — PROTIME-INR
INR: 1 (ref 0.8–1.2)
Prothrombin Time: 13 seconds (ref 11.4–15.2)

## 2021-03-24 MED ORDER — GADOBUTROL 1 MMOL/ML IV SOLN
10.0000 mL | Freq: Once | INTRAVENOUS | Status: AC | PRN
  Administered 2021-03-24: 17:00:00 10 mL via INTRAVENOUS

## 2021-03-24 MED ORDER — SODIUM CHLORIDE 0.9% FLUSH
3.0000 mL | Freq: Once | INTRAVENOUS | Status: AC
  Administered 2021-03-24: 15:00:00 3 mL via INTRAVENOUS

## 2021-03-24 MED ORDER — ASPIRIN EC 81 MG PO TBEC: 81.0000 mg | DELAYED_RELEASE_TABLET | Freq: Every day | ORAL | 2 refills | Status: AC

## 2021-03-24 NOTE — Code Documentation (Signed)
Stroke Response Nurse Documentation Code Documentation  [name] is a 50 y.o. female arriving to [facility] ED via [employer] on 03/24/2021 . On no anticoagulants. Code stroke was activated by Triage RN.   Patient from home where she was LKW at 1330 and now complaining of left sided facial numbness. Pt states that she had a sudden onset of rt sided headache and occipital headache. Pt then reports that she felt like she had numbness to the left side of her face.   Stroke team at the bedside on patient arrival. Labs drawn and patient cleared for CT by Dr. Starleen Blue. Patient to CT with team. NIHSS 0, see documentation for details and code stroke times. The following imaging was completed:  CT Head. Patient is not a candidate for IV Thrombolytic at this time due to symptoms being too mild to treat per Dr Leonel Ramsay. Pt to have Q33min VS and Neur checks until 1800 and be ordered MRA head and neck.  Bedside handoff with ED RN Ria Comment.    Velta Addison Stroke Response RN

## 2021-03-24 NOTE — ED Notes (Signed)
Patient ambulatory to restroom without difficulties. No complaints at this time.

## 2021-03-24 NOTE — ED Provider Notes (Signed)
Emergency Medicine Provider Triage Evaluation Note  [name], a 49 y.o. female  was evaluated in triage.  Pt complains of right-sided headache and left-sided facial numbness at about 130 this afternoon.  Patient was at work when onset of symptoms began.  She denies any current pain or discomfort.  She denies any vision loss, slurred speech, or syncope.  Review of Systems  Positive: Facial numbness, HA Negative: CP, SOB  Physical Exam  BP (!) 147/96 (BP Location: Right Arm)   Pulse (!) 105   Temp 98.6 F (37 C) (Oral)   Resp 20   Ht 5\' 8"  (1.727 m)   Wt 98.9 kg   SpO2 97%   BMI 33.15 kg/m  Gen:   Awake, no distress  NAD Resp:  Normal effort CTA MSK:   Moves extremities without difficulty  Other:  CVS: RRR  Medical Decision Making  Medically screening exam initiated at 2:28 PM.  Appropriate orders placed.  Latunya A Ardito was informed that the remainder of the evaluation will be completed by another provider, this initial triage assessment does not replace that evaluation, and the importance of remaining in the ED until their evaluation is complete.  Patient ED evaluation of left-sided facial numbness, last known well at 1:30 this afternoon.   Melvenia Needles, PA-C 03/24/21 1429    Carrie Mew, MD 03/24/21 [phone]

## 2021-03-24 NOTE — ED Triage Notes (Signed)
Pt states she had a right sided HA today and then began having left sided facial numbness today at 1330 Denies any other sx, ambulatory with a steady gait.

## 2021-03-24 NOTE — Progress Notes (Signed)
   03/24/21 1445  Clinical Encounter Type  Visited With Patient;Other (Comment) (co-worker in lobby)  Visit Type Initial;Spiritual support;Social support;Code  Spiritual Encounters  Spiritual Needs Emotional  Chaplain Deloria Lair responded to code stroke. Chaplain offered non-anxious, compassionate presence. Chaplain normalized emotions and encouraged Pt to center her own needs when she attempted to minmize self when considering contacting spouse. Chaplain encouraged deep breathing and offered general emotional support. Chaplain spoke with co-worker who transported Pt in HIPAA compliant manner, thanked her for her support. Will continue to f/u as needed.

## 2021-03-24 NOTE — ED Provider Notes (Signed)
[facility]  ____________________________________________   Event Date/Time   First MD Initiated Contact with Patient 03/24/21 1502     (approximate)  I have reviewed the triage vital signs and the nursing notes.   HISTORY  Chief Complaint Numbness    HPI [name] is a 50 y.o. female with medical history of GERD and hypertension who presents with left facial numbness.  Around 130 today patient felt a pain in the back of her head/left side of her neck.  She then started to feel left-sided facial numbness which then began to feel like tingling.  She has had some intermittent numbness in her third and fourth digits of the left hand which she thought was related to a bulging disc in her neck but denies any new numbness or weakness in the left arm or leg during this time.  She denies difficulty seeing or aphasia.  Denies ongoing headache.  She does still have some left facial numbness but is starting to feel more sensation.  Has never had a similar episode.  Denies recent illnesses fevers chills chest pain or shortness of breath.         Past Medical History:  Diagnosis Date   Complication of anesthesia    Elevated lipids    GERD (gastroesophageal reflux disease)    Obesity    PONV (postoperative nausea and vomiting)     Patient Active Problem List   Diagnosis Date Noted   Status post sleeve gastrectomy 11/25/2020   Mild intermittent asthma without complication 54/65/0354   Primary osteoarthritis involving multiple joints 08/21/2019   Class 2 obesity with body mass index (BMI) of 39.0 to 39.9 in adult 03/06/2019   Pain in joint, shoulder region 12/20/2018   Gastroesophageal reflux disease 02/15/2017   S/P laparoscopic cholecystectomy 09/11/2015   Hyperplastic colon polyp 08/30/2015    Past Surgical History:  Procedure Laterality Date   ABDOMINAL HYSTERECTOMY     APPENDECTOMY     CHOLECYSTECTOMY     colonoscopy  2018 or 2019    [place]  2018 or 2019   UPPER GI ENDOSCOPY N/A 11/25/2020   Procedure: UPPER GI ENDOSCOPY;  Surgeon: Johnathan Hausen, MD;  Location: [facility] ORS;  Service: General;  Laterality: N/A;    Prior to Admission medications   Medication Sig Start Date End Date Taking? Authorizing Provider  calcium citrate-vitamin D (CALCIUM CITRATE CHEWY BITE) 500-500 MG-UNIT chewable tablet Chew 1 tablet by mouth 3 (three) times daily.    [provider]  Cyanocobalamin (VITAMIN B-12) 5000 MCG SUBL Place 5,000 mcg under the tongue once a week.    [provider]  naproxen (NAPROSYN) 500 MG tablet Take 1 tablet (500 mg total) by mouth 2 (two) times daily with a meal. 02/06/21   Sable Feil, PA-C  ondansetron (ZOFRAN-ODT) 4 MG disintegrating tablet Take 1 tablet (4 mg total) by mouth every 6 (six) hours as needed for nausea or vomiting. Patient not taking: Reported on 02/20/2021 11/26/20   Johnathan Hausen, MD  orphenadrine (NORFLEX) 100 MG tablet Take 1 tablet (100 mg total) by mouth 2 (two) times daily. 02/06/21   Sable Feil, PA-C  oxyCODONE (OXY IR/ROXICODONE) 5 MG immediate release tablet Take 1 tablet (5 mg total) by mouth every 6 (six) hours as needed for severe pain. Patient not taking: Reported on 02/20/2021 11/26/20   Johnathan Hausen, MD  pantoprazole (PROTONIX) 40 MG tablet Take 1 tablet (40 mg  total) by mouth daily. 11/26/20   Johnathan Hausen, MD  Turmeric (QC TUMERIC COMPLEX) 500 MG CAPS Take 1 capsule by mouth.    [provider]  VITAMIN D PO Take 1 tablet by mouth daily.    [provider]    Allergies Patient has no known allergies.  Family History  Problem Relation Age of Onset   Breast cancer Maternal Aunt    Diabetes Mother    Diabetes Maternal Grandfather    Diabetes Maternal Grandmother    Diabetes Paternal Grandmother     Social History Social History   Tobacco Use   Smoking status: Former    Packs/day: 1.00     Types: Cigarettes    Quit date: 04/2018    Years since quitting: 2.9   Smokeless tobacco: Never  Vaping Use   Vaping Use: Never used  Substance Use Topics   Alcohol use: Yes    Alcohol/week: 0.0 - 2.0 standard drinks   Drug use: Never    Review of Systems   Review of Systems  Constitutional:  Negative for chills and fever.  Eyes:  Negative for visual disturbance.  Respiratory:  Negative for shortness of breath.   Cardiovascular:  Negative for chest pain.  Neurological:  Positive for numbness and headaches. Negative for weakness.  All other systems reviewed and are negative.  Physical Exam Updated Vital Signs BP 122/74   Pulse 92   Temp 98.6 F (37 C) (Oral)   Resp 11   Ht 5\' 8"  (1.727 m)   Wt 98.9 kg   SpO2 98%   BMI 33.15 kg/m   Physical Exam Vitals and nursing note reviewed.  Constitutional:      General: She is not in acute distress.    Appearance: Normal appearance.  HENT:     Head: Normocephalic and atraumatic.  Eyes:     General: No scleral icterus.    Conjunctiva/sclera: Conjunctivae normal.  Pulmonary:     Effort: Pulmonary effort is normal. No respiratory distress.     Breath sounds: No stridor.  Musculoskeletal:        General: No deformity or signs of injury.     Cervical back: Normal range of motion.  Skin:    General: Skin is dry.     Coloration: Skin is not jaundiced or pale.  Neurological:     General: No focal deficit present.     Mental Status: She is alert and oriented to person, place, and time. Mental status is at baseline.     Comments: Aox3, nml speech  PERRL, EOMI, face symmetric, nml tongue movement  5/5 strength in the BL upper and lower extremities  Subjective decrease sensation on the left side of the face including forehead and chin compared to right Sensation grossly intact in the BL upper and lower extremities  Finger-nose-finger intact BL   Psychiatric:        Mood and Affect: Mood normal.        Behavior: Behavior  normal.     LABS (all labs ordered are listed, but only abnormal results are displayed)  Labs Reviewed  CBC - Abnormal; Notable for the following components:      Result Value   RBC 5.35 (*)    Hemoglobin 15.7 (*)    HCT 47.9 (*)    All other components within normal limits  COMPREHENSIVE METABOLIC PANEL - Abnormal; Notable for the following components:   Glucose, Bld 105 (*)    All other components within  normal limits  PROTIME-INR  APTT  DIFFERENTIAL  CBG MONITORING, ED   ____________________________________________  ____________________________________________  RADIOLOGY Almeta Monas, personally viewed and evaluated these images (plain radiographs) as part of my medical decision making, as well as reviewing the written report by the radiologist.  ED MD interpretation:  I reviewed the CT scan of the brain which does not show any acute intracranial process      ____________________________________________   PROCEDURES  Procedure(s) performed (including Critical Care):  Procedures   ____________________________________________   INITIAL IMPRESSION / ASSESSMENT AND PLAN / ED COURSE  50 year old female presents with acute onset of neck pain and left facial numbness.  Stroke alert was called and patient was evaluated by Dr. Leonel Ramsay while in the [place] scanner.  Patient's main symptoms are left facial numbness and she really has no arm or leg involvement.  She does have some numbness in her third and fourth digits of the left hand however this is been going on longer and is associated with neck pain more likely peripheral neuropathy.  On evaluation she has subjective decrease sensation on left side of her face both the left forehead and chin but no facial asymmetry or focal numbness or weakness.  CT head was normal.  Neurology recommends an MRI of the brain and MRA head and neck to further evaluate for possible CVA.  Patient's vital signs and lab work are otherwise  reassuring.    Patient was signed out to oncoming provider at 3 PM, disposition pending her additional imaging. ____________________________________________   FINAL CLINICAL IMPRESSION(S) / ED DIAGNOSES  Final diagnoses:  Facial numbness     ED Discharge Orders     None        Note:  This document was prepared using Dragon voice recognition software and may include unintentional dictation errors.    Rada Hay, MD 03/24/21 1536

## 2021-03-24 NOTE — Consult Note (Signed)
Neurology Consultation Reason for Consult: Left-sided numbness Referring Physician: Charna Archer, C  CC: Left-sided numbness  History is obtained from: Patient  HPI: [name] is a 50 y.o. female who presents with left-sided numbness that started earlier today.  She states that around 1:30 PM she felt a sharp pain on the right side of her face, and subsequently the whole left side of her body went numb.  It then improved, and she had some paresthesia, and then it got numb again and then has gradually improved.  She denies headache currently.  She still feels some paresthesia, but when presented with confrontational sensory testing she states they feel equal.  LKW: 1:30 PM tpa given?: no, mild symptoms NIHSS: 0    ROS: A 14 point ROS was performed and is negative except as noted in the HPI.    Past Medical History:  Diagnosis Date   Complication of anesthesia    Elevated lipids    GERD (gastroesophageal reflux disease)    Obesity    PONV (postoperative nausea and vomiting)      Family History  Problem Relation Age of Onset   Breast cancer Maternal Aunt    Diabetes Mother    Diabetes Maternal Grandfather    Diabetes Maternal Grandmother    Diabetes Paternal Grandmother      Social History:  reports that she quit smoking about 2 years ago. Her smoking use included cigarettes. She smoked an average of 1 pack per day. She has never used smokeless tobacco. She reports current alcohol use. She reports that she does not use drugs.   Exam: Current vital signs: BP 123/86   Pulse 90   Temp 98.6 F (37 C) (Oral)   Resp 17   Ht 5\' 8"  (1.727 m)   Wt 98.9 kg   SpO2 100%   BMI 33.15 kg/m  Vital signs in last 24 hours: Temp:  [98.6 F (37 C)] 98.6 F (37 C) (11/28 1407) Pulse Rate:  [90-105] 90 (11/28 1500) Resp:  [17-20] 17 (11/28 1500) BP: (123-147)/(86-96) 123/86 (11/28 1500) SpO2:  [97 %-100 %] 100 % (11/28 1500) Weight:  [98.9 kg] 98.9 kg (11/28 1426)   Physical  Exam  Constitutional: Appears well-developed and well-nourished.  Psych: Affect appropriate to situation Eyes: No scleral injection HENT: No OP obstruction MSK: no joint deformities.  Cardiovascular: Normal rate and regular rhythm.  Respiratory: Effort normal, non-labored breathing GI: Soft.  No distension. There is no tenderness.  Skin: WDI  Neuro: Mental Status: Patient is awake, alert, oriented to person, place, month, year, and situation. Patient is able to give a clear and coherent history. No signs of aphasia or neglect Cranial Nerves: II: Visual Fields are full. Pupils are equal, round, and reactive to light.   III,IV, VI: EOMI without ptosis or diploplia.  V: Facial sensation is symmetric to temperature VII: Facial movement is symmetric.  VIII: hearing is intact to voice X: Uvula elevates symmetrically XI: Shoulder shrug is symmetric. XII: tongue is midline without atrophy or fasciculations.  Motor: Tone is normal. Bulk is normal. 5/5 strength was present in all four extremities.  Sensory: Sensation is symmetric to light touch and temperature in the arms and legs. Cerebellar: FNF and HKS are intact bilaterally    I have reviewed labs in epic and the results pertinent to this consultation are: CMP-unremarkable CBC-mildly elevated hemoglobin  I have reviewed the images obtained: CT head-unremarkable  Impression: 51 year old female with new onset hemiparesthesia.  The fact that she  is describing positive symptoms(paresthesia) as well as the sharp pain at onset is unusual, but I do think that stroke needs to be considered, complicated migraine could also be considered, but I feel is less likely.  Recommendations: 1) MRI brain, MRA head and neck 2) further recommendations following the above imaging.   Roland Rack, MD [facility] [phone]  If 7pm- 7am, please page neurology on call as listed in [place].

## 2021-03-24 NOTE — ED Notes (Signed)
Patient doing MRI screening at this time. 

## 2021-03-24 NOTE — ED Provider Notes (Signed)
-----------------------------------------   3:03 PM on 03/24/2021 -----------------------------------------  Blood pressure 123/86, pulse 90, temperature 98.6 F (37 C), temperature source Oral, resp. rate 17, height 5\' 8"  (1.727 m), weight 98.9 kg, SpO2 100 %.  Assuming care from Dr. Starleen Blue.  In short, [name] is a 50 y.o. female with a chief complaint of Numbness .  Refer to the original H&P for additional details.  The current plan of care is to follow-up MRI imaging for possible stroke.  ----------------------------------------- 6:05 PM on 03/24/2021 ----------------------------------------- MRI of brain along with MRA of head and neck are negative for acute process, no acute stroke identified or significant stenosis.  Findings reviewed with Dr. Leonel Ramsay of neurology, who agrees that patient is appropriate for outpatient management.  He does recommend starting patient on a daily baby aspirin for risk medication and patient provided with referral to neurology, also counseled to follow-up with her PCP.  She was counseled to return to the ED for new worsening symptoms, patient agrees with plan.    Blake Divine, MD 03/24/21 1806

## 2021-03-24 NOTE — ED Notes (Signed)
Patient at MRI 

## 2021-03-24 NOTE — ED Notes (Signed)
Patient talking on phone. No needs expressed at this time.

## 2021-04-07 ENCOUNTER — Other Ambulatory Visit: Payer: Self-pay

## 2021-04-07 DIAGNOSIS — Z1152 Encounter for screening for COVID-19: Secondary | ICD-10-CM

## 2021-04-07 LAB — POCT INFLUENZA A/B
Influenza A, POC: NEGATIVE
Influenza B, POC: NEGATIVE

## 2021-04-07 LAB — POC COVID19 BINAXNOW: SARS Coronavirus 2 Ag: NEGATIVE

## 2021-04-07 NOTE — Progress Notes (Signed)
04/03/21: cough,sore throat,voice loss, runny nose and green mucous.

## 2021-04-08 LAB — NOVEL CORONAVIRUS, NAA: SARS-CoV-2, NAA: NOT DETECTED

## 2021-04-09 ENCOUNTER — Other Ambulatory Visit: Payer: Self-pay | Admitting: Physician Assistant

## 2021-04-09 ENCOUNTER — Encounter: Payer: Self-pay | Admitting: Physician Assistant

## 2021-04-09 ENCOUNTER — Other Ambulatory Visit: Payer: Self-pay

## 2021-04-09 ENCOUNTER — Ambulatory Visit: Payer: Self-pay | Admitting: Physician Assistant

## 2021-04-09 VITALS — BP 118/82 | HR 98 | Temp 97.3°F | Resp 16

## 2021-04-09 DIAGNOSIS — J0100 Acute maxillary sinusitis, unspecified: Secondary | ICD-10-CM

## 2021-04-09 MED ORDER — HYDROCOD POLST-CPM POLST ER 10-8 MG/5ML PO SUER: 5.0000 mL | Freq: Every evening | ORAL | 0 refills | Status: DC | PRN

## 2021-04-09 MED ORDER — FEXOFENADINE-PSEUDOEPHED ER 60-120 MG PO TB12: 1.0000 | ORAL_TABLET | Freq: Two times a day (BID) | ORAL | 0 refills | Status: DC

## 2021-04-09 MED ORDER — AMOXICILLIN 875 MG PO TABS: 875.0000 mg | ORAL_TABLET | Freq: Two times a day (BID) | ORAL | 0 refills | Status: AC

## 2021-04-09 NOTE — Progress Notes (Signed)
° °  Subjective: Sinus congestion    Patient ID: [name], female    DOB: [birth year], 49 y.o.   MRN: [id number]  HPI Patient presents with greater than 1 week of nasal congestion, ear pressure, green nasal discharge, postnasal drainage, and cough with worsens at night.  Patient tested negative for COVID-19 and influenza.   Review of Systems Asthma and GERD.    Objective:   Physical Exam Mild distress.  Temperature 97.3, pulse 98, respiration 16, BP is 118/82, and patient 90% O2 sat on room air. HEENT is remarkable bilateral edematous nasal turbinates, bilateral maxillary guarding, and postnasal drainage. Neck is supple without adenectomy or bruits.  Neck is supple without lymphadenopathy or bruits.  Lungs are clear to auscultation.  Heart regular rate and rhythm.       Assessment & Plan: Subacute maxillary sinusitis   Patient given prescription for Amoxil and Allegra-D.  Patient also given prescription for Tussionex to take only at night before sleeping.  Follow-up with no improvement 3 to 5 days.Marland Kitchen

## 2021-04-09 NOTE — Progress Notes (Signed)
S/Sx Cough - green phlegm in the mornings Nasal drainage - green  Ears popping Sinus pain & pressure around eyes Headache  Covid PCR = Negative  AMD

## 2021-06-17 ENCOUNTER — Ambulatory Visit: Payer: Self-pay

## 2021-06-17 ENCOUNTER — Other Ambulatory Visit: Payer: Self-pay

## 2021-06-17 DIAGNOSIS — Z0000 Encounter for general adult medical examination without abnormal findings: Secondary | ICD-10-CM

## 2021-06-17 LAB — POCT URINALYSIS DIPSTICK
Bilirubin, UA: NEGATIVE
Blood, UA: NEGATIVE
Glucose, UA: NEGATIVE
Ketones, UA: NEGATIVE
Leukocytes, UA: NEGATIVE
Nitrite, UA: NEGATIVE
Protein, UA: POSITIVE — AB
Spec Grav, UA: 1.015 (ref 1.010–1.025)
Urobilinogen, UA: 0.2 E.U./dL
pH, UA: 6 (ref 5.0–8.0)

## 2021-06-17 NOTE — Progress Notes (Signed)
06/23/21 PE scheduled.

## 2021-06-18 LAB — CMP12+LP+TP+TSH+6AC+CBC/D/PLT
ALT: 11 IU/L (ref 0–32)
AST: 23 IU/L (ref 0–40)
Albumin/Globulin Ratio: 1.9 (ref 1.2–2.2)
Albumin: 4.8 g/dL (ref 3.8–4.8)
Alkaline Phosphatase: 104 IU/L (ref 44–121)
BUN/Creatinine Ratio: 17 (ref 9–23)
BUN: 13 mg/dL (ref 6–24)
Basophils Absolute: 0.1 10*3/uL (ref 0.0–0.2)
Basos: 1 %
Bilirubin Total: 0.8 mg/dL (ref 0.0–1.2)
Calcium: 9.9 mg/dL (ref 8.7–10.2)
Chloride: 102 mmol/L (ref 96–106)
Chol/HDL Ratio: 3.7 ratio (ref 0.0–4.4)
Cholesterol, Total: 237 mg/dL — ABNORMAL HIGH (ref 100–199)
Creatinine, Ser: 0.77 mg/dL (ref 0.57–1.00)
EOS (ABSOLUTE): 0.2 10*3/uL (ref 0.0–0.4)
Eos: 2 %
Estimated CHD Risk: 0.7 times avg. (ref 0.0–1.0)
Free Thyroxine Index: 1.8 (ref 1.2–4.9)
GGT: 55 IU/L (ref 0–60)
Globulin, Total: 2.5 g/dL (ref 1.5–4.5)
Glucose: 86 mg/dL (ref 70–99)
HDL: 64 mg/dL (ref >39)
Hematocrit: 45.6 % (ref 34.0–46.6)
Hemoglobin: 15.1 g/dL (ref 11.1–15.9)
Immature Grans (Abs): 0 10*3/uL (ref 0.0–0.1)
Immature Granulocytes: 0 %
Iron: 86 ug/dL (ref 27–159)
LDH: 168 IU/L (ref 119–226)
LDL Chol Calc (NIH): 150 mg/dL — ABNORMAL HIGH (ref 0–99)
Lymphocytes Absolute: 2.1 10*3/uL (ref 0.7–3.1)
Lymphs: 27 %
MCH: 28.6 pg (ref 26.6–33.0)
MCHC: 33.1 g/dL (ref 31.5–35.7)
MCV: 86 fL (ref 79–97)
Monocytes Absolute: 0.5 10*3/uL (ref 0.1–0.9)
Monocytes: 6 %
Neutrophils Absolute: 5 10*3/uL (ref 1.4–7.0)
Neutrophils: 64 %
Phosphorus: 4 mg/dL (ref 3.0–4.3)
Platelets: 265 10*3/uL (ref 150–450)
Potassium: 4.7 mmol/L (ref 3.5–5.2)
RBC: 5.28 x10E6/uL (ref 3.77–5.28)
RDW: 12.3 % (ref 11.7–15.4)
Sodium: 141 mmol/L (ref 134–144)
T3 Uptake Ratio: 25 % (ref 24–39)
T4, Total: 7.1 ug/dL (ref 4.5–12.0)
TSH: 1.56 u[IU]/mL (ref 0.450–4.500)
Total Protein: 7.3 g/dL (ref 6.0–8.5)
Triglycerides: 128 mg/dL (ref 0–149)
Uric Acid: 5.5 mg/dL (ref 2.6–6.2)
VLDL Cholesterol Cal: 23 mg/dL (ref 5–40)
WBC: 7.8 10*3/uL (ref 3.4–10.8)
eGFR: 94 mL/min/{1.73_m2} (ref >59)

## 2021-06-23 ENCOUNTER — Ambulatory Visit: Payer: Self-pay | Admitting: Physician Assistant

## 2021-06-23 ENCOUNTER — Other Ambulatory Visit: Payer: Self-pay

## 2021-06-23 ENCOUNTER — Encounter: Payer: Self-pay | Admitting: Physician Assistant

## 2021-06-23 VITALS — BP 124/74 | HR 93 | Temp 97.6°F | Resp 14 | Ht 68.0 in | Wt 211.0 lb

## 2021-06-23 DIAGNOSIS — M4712 Other spondylosis with myelopathy, cervical region: Secondary | ICD-10-CM

## 2021-06-23 DIAGNOSIS — Z0000 Encounter for general adult medical examination without abnormal findings: Secondary | ICD-10-CM

## 2021-06-23 MED ORDER — ORPHENADRINE CITRATE ER 100 MG PO TB12
100.0000 mg | ORAL_TABLET | Freq: Two times a day (BID) | ORAL | 0 refills | Status: DC
Start: 2021-06-23 — End: 2022-03-30

## 2021-06-23 MED ORDER — ROSUVASTATIN CALCIUM 40 MG PO TABS: 40.0000 mg | ORAL_TABLET | Freq: Every day | ORAL | 3 refills | Status: DC

## 2021-06-23 MED ORDER — MELOXICAM 15 MG PO TABS: 15.0000 mg | ORAL_TABLET | Freq: Every day | ORAL | 0 refills | Status: DC

## 2021-06-23 NOTE — Progress Notes (Signed)
Rx refill on orphenadrine 100mg 

## 2021-06-23 NOTE — Progress Notes (Signed)
[facility]  ____________________________________________   None    (approximate)  I have reviewed the triage vital signs and the nursing notes.   HISTORY  Chief Complaint Annual Exam  HPI [name] is a 51 y.o. female patient presents for annual physical exam.  Patient complain intermitting radiculopathy to the bilateral upper extremities.  Patient has a history of osteoarthritis involving cervical spine and multiple joints.         Past Medical History:  Diagnosis Date   Complication of anesthesia    Elevated lipids    GERD (gastroesophageal reflux disease)    Obesity    PONV (postoperative nausea and vomiting)     Patient Active Problem List   Diagnosis Date Noted   Status post sleeve gastrectomy 11/25/2020   Mild intermittent asthma without complication 55/20/8022   Primary osteoarthritis involving multiple joints 08/21/2019   Class 2 obesity with body mass index (BMI) of 39.0 to 39.9 in adult 03/06/2019   Pain in joint, shoulder region 12/20/2018   Gastroesophageal reflux disease 02/15/2017   S/P laparoscopic cholecystectomy 09/11/2015   Hyperplastic colon polyp 08/30/2015    Past Surgical History:  Procedure Laterality Date   ABDOMINAL HYSTERECTOMY     APPENDECTOMY     CHOLECYSTECTOMY     colonoscopy  2018 or 2019   [place]  2018 or 2019   UPPER GI ENDOSCOPY N/A 11/25/2020   Procedure: UPPER GI ENDOSCOPY;  Surgeon: Johnathan Hausen, MD;  Location: [facility];  Service: General;  Laterality: N/A;    Prior to Admission medications   Medication Sig Start Date End Date Taking? Authorizing Provider  meloxicam (MOBIC) 15 MG tablet Take 1 tablet (15 mg total) by mouth daily. 06/23/21  Yes Sable Feil, PA-C  orphenadrine (NORFLEX) 100 MG tablet Take 1 tablet (100 mg total) by mouth 2 (two) times daily. 06/23/21  Yes Sable Feil, PA-C  pantoprazole (PROTONIX) 40 MG tablet Take 1 tablet (40 mg  total) by mouth daily. 11/26/20  Yes Johnathan Hausen, MD  rosuvastatin (CRESTOR) 40 MG tablet Take 1 tablet (40 mg total) by mouth daily. 06/23/21  Yes Sable Feil, PA-C  Turmeric 500 MG CAPS Take 1 capsule by mouth.   Yes [provider]    Allergies Patient has no known allergies.  Family History  Problem Relation Age of Onset   Breast cancer Maternal Aunt    Diabetes Mother    Diabetes Maternal Grandfather    Diabetes Maternal Grandmother    Diabetes Paternal Grandmother     Social History Social History   Tobacco Use   Smoking status: Former    Packs/day: 1.00    Types: Cigarettes    Quit date: 04/2018    Years since quitting: 3.1   Smokeless tobacco: Never  Vaping Use   Vaping Use: Never used  Substance Use Topics   Alcohol use: Yes    Alcohol/week: 0.0 - 2.0 standard drinks   Drug use: Never    Review of Systems Constitutional: No fever/chills Eyes: No visual changes. ENT: No sore throat. Cardiovascular: Denies chest pain. Respiratory: Denies shortness of breath.  Mild asthma Gastrointestinal: No abdominal pain.  No nausea, no vomiting.  No diarrhea.  No constipation.  GERD. Genitourinary: Negative for dysuria. Musculoskeletal: Neck and bilateral shoulder pain.   Skin: Negative for rash. Neurological: Negative for headaches, focal weakness or numbness. Endocrine: Hyperlipidemia  ____________________________________________   PHYSICAL EXAM:  VITAL  SIGNS: Temperature 97.6, pulse 93, respiration 14, BP is 124/74, and patient 97% O2 sat on room air.  Patient weighs 211 pounds and BMI is 32.08. Constitutional: Alert and oriented. Well appearing and in no acute distress. Eyes: Conjunctivae are normal. PERRL. EOMI. Head: Atraumatic. Nose: No congestion/rhinnorhea. Mouth/Throat: Mucous membranes are moist.  Oropharynx non-erythematous. Neck: No stridor.   cervical spine tenderness to palpation at C5 and C6. Hematological/Lymphatic/Immunilogical: No  cervical lymphadenopathy. Cardiovascular: Normal rate, regular rhythm. Grossly normal heart sounds.  Good peripheral circulation. Respiratory: Normal respiratory effort.  No retractions. Lungs CTAB. Gastrointestinal: Soft and nontender. No distention. No abdominal bruits. No CVA tenderness. Genitourinary: Deferred Musculoskeletal:  Heberden lesions bilateral first digit of hand.  No lower extremity tenderness nor edema.  No joint effusions. Neurologic:  Normal speech and language. No gross focal neurologic deficits are appreciated. No gait instability. Skin:  Skin is warm, dry and intact. No rash noted. Psychiatric: Mood and affect are normal. Speech and behavior are normal.  ____________________________________________   LABS  _______Order: [id number] Status: Final result    Visible to patient: Yes (seen)    Next appt: 09/25/2021 at 08:15 AM in [facility] ([facility])    Dx: Routine adult health maintenance    0 Result Notes          Component Ref Range & Units 6 d ago (06/17/21) 9 mo ago (09/25/20) 1 yr ago (08/18/19) 1 yr ago (08/08/19) 1 yr ago (08/08/19) 5 yr ago (08/15/15)  Color, UA  amber  dark yellow  Amber  Yellow  Yellow R    Clarity, UA  clear  cloudy  Cloudy  Clear     Glucose, UA Negative Negative  Negative  Negative  Negative     Bilirubin, UA  negative  negative  Negative  Negative  Negative R    Ketones, UA  negative  negative  Negative  Negative  Negative R    Spec Grav, UA 1.010 - 1.025 1.015  >=1.030 Abnormal   1.025  >=1.030 Abnormal   1.026 R    Blood, UA  negative  negative  Negative  Positve CM  Negative R    pH, UA 5.0 - 8.0 6.0  6.0  6.0  5.5  5.0 R    Protein, UA Negative Positive Abnormal   Positive Abnormal  CM  Negative  Negative  Negative R    Comment: trace -+  Urobilinogen, UA 0.2 or 1.0 E.U./dL 0.2  0.2  0.2  0.2     Nitrite, UA  negative  negative  Negative  Negative  Negative R    Leukocytes, UA Negative Negative  Negative  Negative  Negative   Negative  NEGATIVE R   Appearance  scant  dark       HAZY Abnormal  R   Odor     Positive VC       Resulting Agency      [facility]         Specimen Collected: 06/17/21 09:07 Last Resulted: 06/17/21 09:07      Lab Flowsheet    Order Details    View Encounter    Lab and Collection Details    Routing    Result History    View Encounter Conversation      VC=Value has a corrected status   CM=Additional comments  R=Reference range differs from displayed range      Result Care Coordination   Patient Communication   Add Comments  Seen Back to Top       Other Results from 06/17/2021   Contains abnormal data CMP12+LP+TP+TSH+6AC+CBC/D/Plt Order: [id number] Status: Final result    Visible to patient: Yes (seen)    Next appt: 09/25/2021 at 08:15 AM in [facility] ([facility])    Dx: Routine adult health maintenance    0 Result Notes           Component Ref Range & Units 6 d ago (06/17/21) 3 mo ago (03/24/21) 3 mo ago (03/24/21) 3 mo ago (03/24/21) 6 mo ago (11/26/20) 7 mo ago (11/25/20) 7 mo ago (11/25/20)  Glucose 70 - 99 mg/dL 86  105 High  CM        Uric Acid 2.6 - 6.2 mg/dL 5.5         Comment:            Therapeutic target for gout patients: <6.0  BUN 6 - 24 mg/dL 13  14 R        Creatinine, Ser 0.57 - 1.00 mg/dL 0.77  0.56 R     0.69 R    eGFR >59 mL/min/1.73 94         BUN/Creatinine Ratio 9 - 23 17         Sodium 134 - 144 mmol/L 141  139 R        Potassium 3.5 - 5.2 mmol/L 4.7  4.0 R        Chloride 96 - 106 mmol/L 102  105 R        Calcium 8.7 - 10.2 mg/dL 9.9  9.2 R        Phosphorus 3.0 - 4.3 mg/dL 4.0         Total Protein 6.0 - 8.5 g/dL 7.3  7.8 R        Albumin 3.8 - 4.8 g/dL 4.8  4.4 R        Globulin, Total 1.5 - 4.5 g/dL 2.5         Albumin/Globulin Ratio 1.2 - 2.2 1.9         Bilirubin Total 0.0 - 1.2 mg/dL 0.8  1.1 R        Alkaline Phosphatase 44 - 121 IU/L 104  74 R        LDH 119 - 226 IU/L 168         AST 0 - 40 IU/L 23  33 R        ALT  0 - 32 IU/L 11  15 R        GGT 0 - 60 IU/L 55         Iron 27 - 159 ug/dL 86         Cholesterol, Total 100 - 199 mg/dL 237 High          Triglycerides 0 - 149 mg/dL 128         HDL >39 mg/dL 64         VLDL Cholesterol Cal 5 - 40 mg/dL 23         LDL Chol Calc (NIH) 0 - 99 mg/dL 150 High          Chol/HDL Ratio 0.0 - 4.4 ratio 3.7         Comment:                                   T. Chol/HDL Ratio  Men  Women                                1/2 Avg.Risk  3.4    3.3                                    Avg.Risk  5.0    4.4                                 2X Avg.Risk  9.6    7.1                                 3X Avg.Risk 23.4   11.0   Estimated CHD Risk 0.0 - 1.0 times avg. 0.7         Comment: The CHD Risk is based on the T. Chol/HDL ratio. Other  factors affect CHD Risk such as hypertension, smoking,  diabetes, severe obesity, and family history of  premature CHD.   TSH 0.450 - 4.500 uIU/mL 1.560         T4, Total 4.5 - 12.0 ug/dL 7.1         T3 Uptake Ratio 24 - 39 % 25         Free Thyroxine Index 1.2 - 4.9 1.8         WBC 3.4 - 10.8 x10E3/uL 7.8    7.7 R  10.2 R     RBC 3.77 - 5.28 x10E6/uL 5.28    5.35 High  R  4.61 R     Hemoglobin 11.1 - 15.9 g/dL 15.1    15.7 High  R  13.4 R   14.3 R   Hematocrit 34.0 - 46.6 % 45.6    47.9 High  R  41.9 R   44.5 R, CM   MCV 79 - 97 fL 86    89.5 R  90.9 R     MCH 26.6 - 33.0 pg 28.6    29.3 R  29.1 R     MCHC 31.5 - 35.7 g/dL 33.1    32.8 R  32.0 R     RDW 11.7 - 15.4 % 12.3    12.7 R  12.3 R     Platelets 150 - 450 x10E3/uL 265    231 R  240 R     Neutrophils Not Estab. % 64   59 R   87 R     Lymphs Not Estab. % 27         Monocytes Not Estab. % 6         Eos Not Estab. % 2         Basos Not Estab. % 1         Neutrophils Absolute 1.4 - 7.0 x10E3/uL 5.0   4.5 R   8.9 High  R     Lymphocytes Absolute 0.7 - 3.1 x10E3/uL 2.1   2.2 R   0.9 R     Monocytes Absolute 0.1 - 0.9 x10E3/uL 0.5          EOS (ABSOLUTE) 0.0 - 0.4 x10E3/uL 0.2         Basophils Absolute 0.0 - 0.2 x10E3/uL 0.1   0.1 R  0.0 R     Immature Granulocytes Not Estab. % 0   0 R   1 R     Immature Grans             ________________________  EKG  Sinus  Rhythm at 91 bpm. WITHIN NORMAL LIMITS ____________________________________________   ____________________________________________   INITIAL IMPRESSION / ASSESSMENT AND PLAN As part of my medical decision making, I reviewed the following data within the [facility]      Discussed lab results and EKG findings with patient.  Patient cholesterol has increased since last visit.  Patient is amenable to a trial of statin for 7-monthfollow-up.  Patient has bony nodule lesions consistent with Heberden formation.  Patient states no hand pain at this time.  Patient will be given a prescription for Crestor and meloxicam.        ____________________________________________   FINAL CLINICAL IMPRESSION(S) Well exam.  ED Discharge Orders          Ordered    rosuvastatin (CRESTOR) 40 MG tablet  Daily        06/23/21 1314    orphenadrine (NORFLEX) 100 MG tablet  2 times daily        06/23/21 1314    meloxicam (MOBIC) 15 MG tablet  Daily        06/23/21 1314             Note:  This document was prepared using Dragon voice recognition software and may include unintentional dictation errors.

## 2021-06-24 ENCOUNTER — Ambulatory Visit: Payer: Self-pay

## 2021-06-24 VITALS — BP 118/70 | HR 102 | Resp 14

## 2021-06-24 DIAGNOSIS — E162 Hypoglycemia, unspecified: Secondary | ICD-10-CM

## 2021-06-24 LAB — GLUCOSE, POCT (MANUAL RESULT ENTRY)
POC Glucose: 51 mg/dl — AB (ref 70–99)
POC Glucose: 75 mg/dl (ref 70–99)

## 2021-06-24 NOTE — Progress Notes (Signed)
Patient arrived at clinic at 1120m reported feeling lightheaded. Skin pale and clammy. She was seated in recliner with ice applied to back of neck. BP 138/70 Pulse 108  O2 98% Blood sugar 51 RR 16. Provided [place] hydration  and nutrigrain bar over 15 minutes. All symptoms resolved by 1220pm. Blood pressure 118/70, pulse 102 pulse ox 98  BS 75.   Upon review, Courtni's last meal was 8 am, 1/2 pancake and 1 piece of bacon. Teaching provided r/t importance of combining protein / fat or fiber with carbs for meals to stabilize blood sugars and eating every few hours.   Case reviewed with Randel Pigg who visited patient and  is in agreement with treatment and stabilized condition.

## 2021-08-28 ENCOUNTER — Telehealth: Payer: Self-pay

## 2021-08-28 DIAGNOSIS — K635 Polyp of colon: Secondary | ICD-10-CM

## 2021-08-28 DIAGNOSIS — K2270 Barrett's esophagus without dysplasia: Secondary | ICD-10-CM

## 2021-08-28 NOTE — Telephone Encounter (Signed)
Makeya presents to [place] [facility] requesting referral for Upper Endoscopy & Colonoscopy at [facility]. ? ?MD:  Dr. Santa Lighter ?Phone:  [phone] ? ?Last Colonoscopy was 08/26/2015 ?Last Upper Endoscopy was 02/25/2017 ? ?States had procedures scheduled for sometime in 2022, but had to cancel. ? ?When she tried to call recently to reschedule, she was informed she needed a referral from PCP. ? ?Referral put in Epic & faxed to [facility]. ? ?AMD ?

## 2021-09-19 ENCOUNTER — Encounter (HOSPITAL_COMMUNITY): Payer: Self-pay | Admitting: *Deleted

## 2021-09-19 ENCOUNTER — Other Ambulatory Visit: Payer: Self-pay | Admitting: Physician Assistant

## 2021-09-23 ENCOUNTER — Other Ambulatory Visit: Payer: Self-pay | Admitting: Physician Assistant

## 2021-09-23 DIAGNOSIS — Z1231 Encounter for screening mammogram for malignant neoplasm of breast: Secondary | ICD-10-CM

## 2021-09-24 ENCOUNTER — Other Ambulatory Visit: Payer: Self-pay

## 2021-09-24 DIAGNOSIS — E785 Hyperlipidemia, unspecified: Secondary | ICD-10-CM

## 2021-09-24 DIAGNOSIS — E162 Hypoglycemia, unspecified: Secondary | ICD-10-CM

## 2021-09-24 NOTE — Progress Notes (Signed)
Pt presents today for 3 month lipid lab.

## 2021-09-24 NOTE — Addendum Note (Signed)
Addended by: Malachy Moan F on: 09/24/2021 08:12 AM   Modules accepted: Orders

## 2021-09-25 ENCOUNTER — Other Ambulatory Visit: Payer: Self-pay

## 2021-09-25 LAB — LIPID PANEL
Chol/HDL Ratio: 3.7 ratio (ref 0.0–4.4)
Cholesterol, Total: 218 mg/dL — ABNORMAL HIGH (ref 100–199)
HDL: 59 mg/dL (ref >39)
LDL Chol Calc (NIH): 136 mg/dL — ABNORMAL HIGH (ref 0–99)
Triglycerides: 132 mg/dL (ref 0–149)
VLDL Cholesterol Cal: 23 mg/dL (ref 5–40)

## 2021-09-25 LAB — HGB A1C W/O EAG: Hgb A1c MFr Bld: 5.1 % (ref 4.8–5.6)

## 2021-09-26 DIAGNOSIS — Z86010 Personal history of colonic polyps: Secondary | ICD-10-CM | POA: Diagnosis not present

## 2021-09-26 DIAGNOSIS — K22719 Barrett's esophagus with dysplasia, unspecified: Secondary | ICD-10-CM | POA: Diagnosis not present

## 2021-09-26 DIAGNOSIS — K5900 Constipation, unspecified: Secondary | ICD-10-CM | POA: Diagnosis not present

## 2021-09-26 DIAGNOSIS — K635 Polyp of colon: Secondary | ICD-10-CM | POA: Diagnosis not present

## 2021-09-26 DIAGNOSIS — Z6832 Body mass index (BMI) 32.0-32.9, adult: Secondary | ICD-10-CM | POA: Diagnosis not present

## 2021-09-26 DIAGNOSIS — R1319 Other dysphagia: Secondary | ICD-10-CM | POA: Diagnosis not present

## 2021-09-26 DIAGNOSIS — K2100 Gastro-esophageal reflux disease with esophagitis, without bleeding: Secondary | ICD-10-CM | POA: Diagnosis not present

## 2021-09-30 ENCOUNTER — Encounter: Payer: Self-pay | Admitting: Physician Assistant

## 2021-09-30 ENCOUNTER — Ambulatory Visit: Payer: Self-pay | Admitting: Physician Assistant

## 2021-09-30 VITALS — BP 117/86 | HR 92 | Temp 97.8°F | Resp 12

## 2021-09-30 DIAGNOSIS — M7711 Lateral epicondylitis, right elbow: Secondary | ICD-10-CM

## 2021-09-30 MED ORDER — DICLOFENAC SODIUM 1 % EX GEL: 2.0000 g | Freq: Four times a day (QID) | CUTANEOUS | 0 refills | Status: DC

## 2021-09-30 NOTE — Progress Notes (Signed)
   Subjective: Right lateral elbow pain    Patient ID: [name], female    DOB: Jul 10, 1970, 51 y.o.   MRN: [id number]  HPI Patient complaining of right lateral elbow pain this increase in the past 2 weeks.  Patient gives a history of repetitive work uses on the right upper extremity.  Denies loss sensation or loss of function.  Rates the pain as a 5/10.  Described pain as "achy".  Patient stated no relief with over-the-counter ibuprofen 400 mg.   Review of Systems Asthma, GERD, and primary osteoarthritis involving multiple joints.    Objective:   Physical Exam BP is 117/86, pulse 92, respiration 12, temperature 97.8, and patient 99% O2 sat on room air.  Patient is right-hand dominant.  Examination of the right elbow reveals no edema, erythema, or lesions.  Patient is tender to the lateral epicondyle area.  Patient has full Nikkel range of motion.  Patient strength 5/5.       Assessment & Plan: Right lateral epicondylitis.  Patient given discharge care instructions and a tennis elbow strap.  Patient will apply Voltaren ointment as directed.  Advised to follow-up in 2 weeks.  Voltaren

## 2021-09-30 NOTE — Progress Notes (Signed)
Right elbow pain started last week - gradually getting worse each day & present to the clinic today with pain & swelling  Has taken ibuprofen 400 mg 1x day No ice/heat & no topical medications   AMd

## 2021-10-08 DIAGNOSIS — Z9884 Bariatric surgery status: Secondary | ICD-10-CM | POA: Diagnosis not present

## 2021-10-27 ENCOUNTER — Ambulatory Visit
Admission: RE | Admit: 2021-10-27 | Discharge: 2021-10-27 | Disposition: A | Discharge status: Home / Self Care | Payer: 59 | Source: Ambulatory Visit | Attending: Physician Assistant | Admitting: Physician Assistant

## 2021-10-27 DIAGNOSIS — Z1231 Encounter for screening mammogram for malignant neoplasm of breast: Secondary | ICD-10-CM | POA: Diagnosis not present

## 2021-11-07 ENCOUNTER — Ambulatory Visit: Payer: Self-pay

## 2021-11-07 DIAGNOSIS — N951 Menopausal and female climacteric states: Secondary | ICD-10-CM

## 2021-11-08 LAB — FSH/LH
FSH: 39.2 m[IU]/mL
LH: 27.5 m[IU]/mL

## 2021-11-08 LAB — PROGESTERONE: Progesterone: 0.2 ng/mL

## 2021-11-11 ENCOUNTER — Other Ambulatory Visit (HOSPITAL_COMMUNITY): Payer: Self-pay

## 2021-11-14 ENCOUNTER — Other Ambulatory Visit (HOSPITAL_COMMUNITY): Payer: Self-pay

## 2021-11-24 DIAGNOSIS — R69 Illness, unspecified: Secondary | ICD-10-CM | POA: Diagnosis not present

## 2021-11-24 DIAGNOSIS — K5730 Diverticulosis of large intestine without perforation or abscess without bleeding: Secondary | ICD-10-CM | POA: Diagnosis not present

## 2021-11-24 DIAGNOSIS — Z86010 Personal history of colonic polyps: Secondary | ICD-10-CM | POA: Diagnosis not present

## 2021-11-24 DIAGNOSIS — K2289 Other specified disease of esophagus: Secondary | ICD-10-CM | POA: Diagnosis not present

## 2021-11-24 DIAGNOSIS — K2100 Gastro-esophageal reflux disease with esophagitis, without bleeding: Secondary | ICD-10-CM | POA: Diagnosis not present

## 2021-11-24 DIAGNOSIS — K2270 Barrett's esophagus without dysplasia: Secondary | ICD-10-CM | POA: Diagnosis not present

## 2021-11-24 DIAGNOSIS — Z1211 Encounter for screening for malignant neoplasm of colon: Secondary | ICD-10-CM | POA: Diagnosis not present

## 2021-11-24 DIAGNOSIS — R12 Heartburn: Secondary | ICD-10-CM | POA: Diagnosis not present

## 2021-11-24 DIAGNOSIS — R1310 Dysphagia, unspecified: Secondary | ICD-10-CM | POA: Diagnosis not present

## 2021-11-24 DIAGNOSIS — Z9884 Bariatric surgery status: Secondary | ICD-10-CM | POA: Diagnosis not present

## 2021-12-01 DIAGNOSIS — M7711 Lateral epicondylitis, right elbow: Secondary | ICD-10-CM | POA: Diagnosis not present

## 2021-12-23 DIAGNOSIS — Z6832 Body mass index (BMI) 32.0-32.9, adult: Secondary | ICD-10-CM | POA: Diagnosis not present

## 2021-12-23 DIAGNOSIS — K219 Gastro-esophageal reflux disease without esophagitis: Secondary | ICD-10-CM | POA: Diagnosis not present

## 2022-01-07 ENCOUNTER — Encounter: Payer: Self-pay | Admitting: Physician Assistant

## 2022-01-07 ENCOUNTER — Ambulatory Visit: Payer: Self-pay | Admitting: Physician Assistant

## 2022-01-07 DIAGNOSIS — F409 Phobic anxiety disorder, unspecified: Secondary | ICD-10-CM

## 2022-01-07 DIAGNOSIS — F411 Generalized anxiety disorder: Secondary | ICD-10-CM

## 2022-01-07 MED ORDER — ALPRAZOLAM 0.25 MG PO TABS: 0.2500 mg | ORAL_TABLET | Freq: Every day | ORAL | 0 refills | Status: DC

## 2022-01-07 NOTE — Progress Notes (Signed)
Pt presents today with having trouble sleeping for the past two weeks. Pt states she has a lot of anxiety at the moment due to personal family.

## 2022-01-07 NOTE — Progress Notes (Signed)
   Subjective: Anxiety and insomnia.    Patient ID: [name], female    DOB: 03/14/1971, 52 y.o.   MRN: [id number]  HPI Patient presents with 2 weeks of insomnia and anxiety.  Patient states she is having trouble with sleep secondary to personal family issues.  Patient states she falls asleep but wakes up approximately every 2 hours.  She states she believes condition will improve as she adjusts to the family separation this pending.  Voices no suicidal ideologies or harm to others.   Review of Systems GERD    Objective:   Physical Exam  See nurses note for vital signs. Patient appears anxious but no acute distress.      Assessment & Plan: Anxiety and insomnia  Patient is amenable to taking a low-dose Xanax 0.25 mg at night before retiring to sleep.  Patient will follow-up in 2 weeks.  Sooner if condition does not improve.

## 2022-01-28 ENCOUNTER — Ambulatory Visit: Payer: Self-pay | Admitting: Physician Assistant

## 2022-01-28 ENCOUNTER — Encounter: Payer: Self-pay | Admitting: Physician Assistant

## 2022-01-28 VITALS — BP 134/92 | HR 87 | Temp 98.4°F | Resp 16

## 2022-01-28 DIAGNOSIS — J329 Chronic sinusitis, unspecified: Secondary | ICD-10-CM

## 2022-01-28 DIAGNOSIS — R059 Cough, unspecified: Secondary | ICD-10-CM

## 2022-01-28 LAB — POC COVID19 BINAXNOW: SARS Coronavirus 2 Ag: NEGATIVE

## 2022-01-28 MED ORDER — AMOXICILLIN 875 MG PO TABS: 875.0000 mg | ORAL_TABLET | Freq: Two times a day (BID) | ORAL | 0 refills | Status: AC

## 2022-01-28 MED ORDER — FEXOFENADINE-PSEUDOEPHED ER 60-120 MG PO TB12: 1.0000 | ORAL_TABLET | Freq: Two times a day (BID) | ORAL | 0 refills | Status: DC

## 2022-01-28 MED ORDER — HYDROCOD POLI-CHLORPHE POLI ER 10-8 MG/5ML PO SUER: 5.0000 mL | Freq: Every evening | ORAL | 0 refills | Status: DC | PRN

## 2022-01-28 NOTE — Progress Notes (Signed)
   Subjective: Sinus congestion    Patient ID: [name], female    DOB: 01-Nov-1970, 51 y.o.   MRN: [id number]  HPI Patient complains of sinus congestion and nonproductive cough.  Patient states cough is worse at night trying to sleep.  Patient denies recent travel or known contact with COVID-19.  Patient tested negative for COVID-19 today in clinic.   Review of Systems Arthritis and GERD    Objective:   Physical Exam BP is 134/92, pulse 97, respirations 16, temperature 98.4, and patient 90% O2 sat on room air. HEENT is remarkable bilateral maxillary guarding, edematous nasal turbinates, and a nonproductive cough.   Neck is supple without lymphadenopathy or bruits. Lungs are clear to auscultation. Heart is regular rate and rhythm.       Assessment & Plan: Subacute maxillary sinusitis   Patient given discharge care instructions and a prescription for amoxicillin, Allegra-D, and Tussionex.  Advised take medication as directed follow-up 1 week if no improvement or worsening complaints.

## 2022-01-28 NOTE — Progress Notes (Signed)
Reports x 1 week feeling "sick" with URI symptoms with negative covid tests in last week and tested negative for covid today by clinic.  Wearing mask due to cough and s/s.  No dyspnea observed and observed ambulating and communicating without dyspnea.

## 2022-02-20 ENCOUNTER — Other Ambulatory Visit: Payer: Self-pay

## 2022-02-20 DIAGNOSIS — M2550 Pain in unspecified joint: Secondary | ICD-10-CM

## 2022-02-20 NOTE — Progress Notes (Signed)
Pt presents today to comlete labs for joint pain.

## 2022-02-21 LAB — SEDIMENTATION RATE: Sed Rate: 20 mm/hr (ref 0–40)

## 2022-02-21 LAB — ANA W/REFLEX IF POSITIVE: Anti Nuclear Antibody (ANA): NEGATIVE

## 2022-02-21 LAB — RHEUMATOID FACTOR: Rheumatoid fact SerPl-aCnc: 14.7 IU/mL — ABNORMAL HIGH (ref <14.0)

## 2022-02-25 NOTE — Progress Notes (Signed)
[name] was in the clinic on Wednesday (02/18/2022) & informally discussed that she is experiencing joint pain all over her body with Laura Postin, PA-C.  [name] advised [name] to have the following labs - RA factor, ANA & Sed Rated. Appt for labs scheduled for Friday (02/20/2022.  Laura Pigg, PA-C reviewed the lab results with  [name]. Rheumatology referral requested by Laura Pigg, PA-C.  Rheumatology referral put in [place].  Referral & office notes faxed to [facility] Rheumatology Dept.  AMD

## 2022-02-25 NOTE — Addendum Note (Signed)
Addended by: Aliene Altes on: 02/25/2022 09:05 AM   Modules accepted: Orders

## 2022-03-23 DIAGNOSIS — R232 Flushing: Secondary | ICD-10-CM | POA: Diagnosis not present

## 2022-03-23 DIAGNOSIS — N8111 Cystocele, midline: Secondary | ICD-10-CM | POA: Diagnosis not present

## 2022-03-23 DIAGNOSIS — Z01411 Encounter for gynecological examination (general) (routine) with abnormal findings: Secondary | ICD-10-CM | POA: Diagnosis not present

## 2022-03-23 DIAGNOSIS — R69 Illness, unspecified: Secondary | ICD-10-CM | POA: Diagnosis not present

## 2022-03-23 DIAGNOSIS — Z6834 Body mass index (BMI) 34.0-34.9, adult: Secondary | ICD-10-CM | POA: Diagnosis not present

## 2022-03-23 DIAGNOSIS — E669 Obesity, unspecified: Secondary | ICD-10-CM | POA: Diagnosis not present

## 2022-03-23 DIAGNOSIS — Z1272 Encounter for screening for malignant neoplasm of vagina: Secondary | ICD-10-CM | POA: Diagnosis not present

## 2022-03-24 DIAGNOSIS — R69 Illness, unspecified: Secondary | ICD-10-CM | POA: Diagnosis not present

## 2022-03-24 DIAGNOSIS — E669 Obesity, unspecified: Secondary | ICD-10-CM | POA: Diagnosis not present

## 2022-03-30 ENCOUNTER — Encounter: Payer: Self-pay | Admitting: Physician Assistant

## 2022-03-30 ENCOUNTER — Ambulatory Visit: Payer: Self-pay | Admitting: Physician Assistant

## 2022-03-30 VITALS — BP 117/74 | HR 88 | Resp 14 | Ht 68.0 in | Wt 216.0 lb

## 2022-03-30 DIAGNOSIS — R300 Dysuria: Secondary | ICD-10-CM

## 2022-03-30 LAB — POCT URINALYSIS DIPSTICK
Bilirubin, UA: NEGATIVE
Blood, UA: NEGATIVE
Glucose, UA: NEGATIVE
Ketones, UA: NEGATIVE
Nitrite, UA: NEGATIVE
Protein, UA: NEGATIVE
Spec Grav, UA: 1.015 (ref 1.010–1.025)
Urobilinogen, UA: 0.2 E.U./dL
pH, UA: 6 (ref 5.0–8.0)

## 2022-03-30 MED ORDER — NITROFURANTOIN MONOHYD MACRO 100 MG PO CAPS: 100.0000 mg | ORAL_CAPSULE | Freq: Two times a day (BID) | ORAL | 0 refills | Status: DC

## 2022-03-30 MED ORDER — PHENAZOPYRIDINE HCL 95 MG PO TABS: 190.0000 mg | ORAL_TABLET | Freq: Three times a day (TID) | ORAL | 0 refills | Status: DC | PRN

## 2022-03-30 NOTE — Progress Notes (Signed)
   Subjective: Dysuria hematuria    Patient ID: [name], female    DOB: 09-Jun-1970, 51 y.o.   MRN: [id number]  HPI Patient complain of 2 days of dysuria.  States 1 day of hematuria.  Patient also complaining of bladder discomfort.  Denies vaginal discharge.   Review of Systems Negative except for above complaint    Objective:   Physical Exam  BP is 117/74, pulse 88, respiration 14, patient is 99% O2 sat on room air.  Patient weighs 216 pounds and BMI is 32.84. Urinalysis shows 2+ leukocytes. Urine will be sent for culture and sensitivity.     Assessment & Plan: UTI   Patient given a prescription for Macrobid and Pyridium.  Patient advised to follow-up in 1 week for retesting urine.

## 2022-03-30 NOTE — Progress Notes (Signed)
Pt presents today with dysuria Saturday and stated she had blood yesterday everytime she voided but none today just burning sensation.

## 2022-04-02 ENCOUNTER — Other Ambulatory Visit: Payer: Self-pay | Admitting: Physician Assistant

## 2022-04-02 LAB — URINE CULTURE

## 2022-04-02 MED ORDER — FLUCONAZOLE 150 MG PO TABS: 150.0000 mg | ORAL_TABLET | Freq: Once | ORAL | 0 refills | Status: AC

## 2022-04-02 MED ORDER — CIPROFLOXACIN HCL 500 MG PO TABS: 500.0000 mg | ORAL_TABLET | Freq: Two times a day (BID) | ORAL | 0 refills | Status: DC

## 2022-04-23 ENCOUNTER — Ambulatory Visit
Admission: RE | Admit: 2022-04-23 | Discharge: 2022-04-23 | Disposition: A | Discharge status: Home / Self Care | Payer: 59 | Source: Ambulatory Visit | Attending: Physician Assistant | Admitting: Physician Assistant

## 2022-04-23 ENCOUNTER — Encounter: Payer: Self-pay | Admitting: Physician Assistant

## 2022-04-23 ENCOUNTER — Other Ambulatory Visit: Payer: Self-pay | Admitting: Physician Assistant

## 2022-04-23 ENCOUNTER — Ambulatory Visit
Admission: RE | Admit: 2022-04-23 | Discharge: 2022-04-23 | Disposition: A | Discharge status: Home / Self Care | Payer: 59 | Attending: Physician Assistant | Admitting: Physician Assistant

## 2022-04-23 ENCOUNTER — Ambulatory Visit: Payer: 59 | Admitting: Physician Assistant

## 2022-04-23 VITALS — BP 107/77 | HR 110 | Temp 98.1°F | Resp 16

## 2022-04-23 DIAGNOSIS — R0989 Other specified symptoms and signs involving the circulatory and respiratory systems: Secondary | ICD-10-CM

## 2022-04-23 DIAGNOSIS — R053 Chronic cough: Secondary | ICD-10-CM

## 2022-04-23 DIAGNOSIS — R059 Cough, unspecified: Secondary | ICD-10-CM | POA: Diagnosis not present

## 2022-04-23 LAB — POC COVID19 BINAXNOW: SARS Coronavirus 2 Ag: NEGATIVE

## 2022-04-23 MED ORDER — HYDROCODONE BIT-HOMATROP MBR 5-1.5 MG/5ML PO SOLN: 5.0000 mL | Freq: Three times a day (TID) | ORAL | 0 refills | Status: DC | PRN

## 2022-04-23 MED ORDER — METHYLPREDNISOLONE 4 MG PO TBPK: ORAL_TABLET | ORAL | 0 refills | Status: DC

## 2022-04-23 NOTE — Progress Notes (Signed)
Stated cough dry and chest congestion worsened day 7 with hx bronchitis.  Stated some meds expired and has been trying nebs at home with Sudafed and cough med.  Denies fever.  Tested negative for covid in clinic.

## 2022-04-23 NOTE — Progress Notes (Signed)
   Subjective: Cough and chest congestion    Patient ID: [name], female    DOB: 11-02-1970, 51 y.o.   MRN: [id number]  HPI Patient complain of cough and chest congestion for 1 week.  Patient has a history of recurrent bronchitis.  Patient stated mild relief using nebulizer and over-the-counter cough medicine.  Denies fever/chills.  Tested negative for COVID-19 today.   Review of Systems Asthma and GERD    Objective:   Physical Exam  BP is 107/77, pulse 110, respirations 16, temperature 98.1, patient 96% O2 sat on room air. HEENT is unremarkable. Neck is supple followed lymphadenectomy or bruits. Lungs with bilateral Rales.      Assessment & Plan: Upper respiratory infection   Differentials consist of bronchitis versus pneumonia.  Further evaluation with chest x-ray is warranted.

## 2022-05-20 DIAGNOSIS — N8110 Cystocele, unspecified: Secondary | ICD-10-CM | POA: Diagnosis not present

## 2022-05-20 DIAGNOSIS — R232 Flushing: Secondary | ICD-10-CM | POA: Diagnosis not present

## 2022-05-20 DIAGNOSIS — R69 Illness, unspecified: Secondary | ICD-10-CM | POA: Diagnosis not present

## 2022-06-08 ENCOUNTER — Ambulatory Visit: Payer: Self-pay

## 2022-06-08 DIAGNOSIS — Z0000 Encounter for general adult medical examination without abnormal findings: Secondary | ICD-10-CM

## 2022-06-08 LAB — POCT URINALYSIS DIPSTICK
Bilirubin, UA: NEGATIVE
Blood, UA: NEGATIVE
Glucose, UA: NEGATIVE
Ketones, UA: NEGATIVE
Leukocytes, UA: NEGATIVE
Nitrite, UA: NEGATIVE
Protein, UA: NEGATIVE
Spec Grav, UA: >=1.03 — AB (ref 1.010–1.025)
Urobilinogen, UA: 0.2 E.U./dL
pH, UA: 6 (ref 5.0–8.0)

## 2022-06-09 LAB — CMP12+LP+TP+TSH+6AC+CBC/D/PLT
ALT: 8 IU/L (ref 0–32)
AST: 17 IU/L (ref 0–40)
Albumin/Globulin Ratio: 1.8 (ref 1.2–2.2)
Albumin: 4.5 g/dL (ref 3.8–4.9)
Alkaline Phosphatase: 84 IU/L (ref 44–121)
BUN/Creatinine Ratio: 23 (ref 9–23)
BUN: 14 mg/dL (ref 6–24)
Basophils Absolute: 0 10*3/uL (ref 0.0–0.2)
Basos: 1 %
Bilirubin Total: 0.7 mg/dL (ref 0.0–1.2)
Calcium: 9.4 mg/dL (ref 8.7–10.2)
Chloride: 105 mmol/L (ref 96–106)
Chol/HDL Ratio: 3.4 ratio (ref 0.0–4.4)
Cholesterol, Total: 227 mg/dL — ABNORMAL HIGH (ref 100–199)
Creatinine, Ser: 0.62 mg/dL (ref 0.57–1.00)
EOS (ABSOLUTE): 0.1 10*3/uL (ref 0.0–0.4)
Eos: 2 %
Estimated CHD Risk: 0.5 times avg. (ref 0.0–1.0)
Free Thyroxine Index: 2.1 (ref 1.2–4.9)
GGT: 31 IU/L (ref 0–60)
Globulin, Total: 2.5 g/dL (ref 1.5–4.5)
Glucose: 91 mg/dL (ref 70–99)
HDL: 67 mg/dL (ref >39)
Hematocrit: 44.6 % (ref 34.0–46.6)
Hemoglobin: 14.5 g/dL (ref 11.1–15.9)
Immature Grans (Abs): 0 10*3/uL (ref 0.0–0.1)
Immature Granulocytes: 0 %
Iron: 99 ug/dL (ref 27–159)
LDH: 151 IU/L (ref 119–226)
LDL Chol Calc (NIH): 136 mg/dL — ABNORMAL HIGH (ref 0–99)
Lymphocytes Absolute: 1.9 10*3/uL (ref 0.7–3.1)
Lymphs: 30 %
MCH: 28.7 pg (ref 26.6–33.0)
MCHC: 32.5 g/dL (ref 31.5–35.7)
MCV: 88 fL (ref 79–97)
Monocytes Absolute: 0.5 10*3/uL (ref 0.1–0.9)
Monocytes: 8 %
Neutrophils Absolute: 3.8 10*3/uL (ref 1.4–7.0)
Neutrophils: 59 %
Phosphorus: 3.7 mg/dL (ref 3.0–4.3)
Platelets: 248 10*3/uL (ref 150–450)
Potassium: 4.6 mmol/L (ref 3.5–5.2)
RBC: 5.06 x10E6/uL (ref 3.77–5.28)
RDW: 12.4 % (ref 11.7–15.4)
Sodium: 140 mmol/L (ref 134–144)
T3 Uptake Ratio: 26 % (ref 24–39)
T4, Total: 7.9 ug/dL (ref 4.5–12.0)
TSH: 1.17 u[IU]/mL (ref 0.450–4.500)
Total Protein: 7 g/dL (ref 6.0–8.5)
Triglycerides: 137 mg/dL (ref 0–149)
Uric Acid: 6.3 mg/dL (ref 3.0–7.2)
VLDL Cholesterol Cal: 24 mg/dL (ref 5–40)
WBC: 6.3 10*3/uL (ref 3.4–10.8)
eGFR: 108 mL/min/{1.73_m2} (ref >59)

## 2022-06-17 ENCOUNTER — Ambulatory Visit: Payer: Self-pay | Admitting: Physician Assistant

## 2022-06-17 ENCOUNTER — Encounter: Payer: Self-pay | Admitting: Physician Assistant

## 2022-06-17 VITALS — BP 122/73 | HR 87 | Temp 98.0°F | Resp 14 | Ht 68.0 in | Wt 215.0 lb

## 2022-06-17 DIAGNOSIS — Z0000 Encounter for general adult medical examination without abnormal findings: Secondary | ICD-10-CM

## 2022-06-17 MED ORDER — DICLOFENAC SODIUM 1 % EX GEL: 2.0000 g | Freq: Four times a day (QID) | CUTANEOUS | 1 refills | Status: DC

## 2022-06-17 NOTE — Progress Notes (Signed)
[facility]  ____________________________________________   None    (approximate)  I have reviewed the triage vital signs and the nursing notes.   HISTORY  Chief Complaint Annual Exam   HPI [name] is a 52 y.o. female patient presents for annual physical exam.  Patient admits to noncompliance of statin for lipid control.         Past Medical History:  Diagnosis Date   Complication of anesthesia    Elevated lipids    GERD (gastroesophageal reflux disease)    Obesity    PONV (postoperative nausea and vomiting)     Patient Active Problem List   Diagnosis Date Noted   Constipation 09/26/2021   Status post sleeve gastrectomy 11/25/2020   Mild intermittent asthma without complication [id number]   Primary osteoarthritis involving multiple joints 08/21/2019   Morbid obesity ([place]) 03/06/2019   Pain in joint, shoulder region 12/20/2018   Gastroesophageal reflux disease 02/15/2017   S/P laparoscopic cholecystectomy 09/11/2015   Hyperplastic colon polyp 08/30/2015    Past Surgical History:  Procedure Laterality Date   ABDOMINAL HYSTERECTOMY     APPENDECTOMY     CHOLECYSTECTOMY     colonoscopy  2018 or 2019   [place]  2018 or 2019   UPPER GI ENDOSCOPY N/A 11/25/2020   Procedure: UPPER GI ENDOSCOPY;  Surgeon: Johnathan Hausen, MD;  Location: [facility];  Service: General;  Laterality: N/A;    Prior to Admission medications   Medication Sig Start Date End Date Taking? Authorizing Provider  albuterol (PROVENTIL) (2.5 MG/3ML) 0.083% nebulizer solution VVN QID PRN   Yes [provider]  ALPRAZolam (XANAX) 0.25 MG tablet Take 1 tablet (0.25 mg total) by mouth at bedtime. 01/07/22  Yes Sable Feil, PA-C  estradiol (ESTRACE) 0.5 MG tablet Take 0.5 mg by mouth. 03/23/22  Yes [provider]  diclofenac Sodium (VOLTAREN ARTHRITIS PAIN) 1 % GEL Apply 2 g topically 4 (four) times  daily. 06/17/22  Yes Sable Feil, PA-C  rosuvastatin (CRESTOR) 40 MG tablet Take 1 tablet (40 mg total) by mouth daily. Patient not taking: Reported on 04/23/2022 06/23/21   Sable Feil, PA-C    Allergies Patient has no known allergies.  Family History  Problem Relation Age of Onset   Breast cancer Maternal Aunt    Diabetes Mother    Diabetes Maternal Grandfather    Diabetes Maternal Grandmother    Diabetes Paternal Grandmother     Social History Social History   Tobacco Use   Smoking status: Former    Packs/day: 1.00    Types: Cigarettes    Quit date: 04/2018    Years since quitting: 4.1   Smokeless tobacco: Never  Vaping Use   Vaping Use: Never used  Substance Use Topics   Alcohol use: Yes    Alcohol/week: 0.0 - 2.0 standard drinks of alcohol   Drug use: Never    Review of Systems Constitutional: No fever/chills Eyes: No visual changes. ENT: No sore throat. Cardiovascular: Denies chest pain. Respiratory: Denies shortness of breath. Gastrointestinal: No abdominal pain.  No nausea, no vomiting.  No diarrhea.  No constipation.  GERD. Genitourinary: Negative for dysuria. Musculoskeletal: Negative for back pain.  Osteoarthritis  skin: Negative for rash. Neurological: Negative for headaches, focal weakness or numbness. Endocrine: Hyperlipidemia  ____________________________________________   PHYSICAL EXAM:  VITAL SIGNS: BP is 122/73, pulse 87, respiration 14, temperature 98, patient is 95% O2 sat  on room air.  Patient weighs 215 pounds and BMI is 32.69. Constitutional: Alert and oriented. Well appearing and in no acute distress. Eyes: Conjunctivae are normal. PERRL. EOMI. Head: Atraumatic. Nose: No congestion/rhinnorhea. Mouth/Throat: Mucous membranes are moist.  Oropharynx non-erythematous. Neck: No stridor.  No cervical spine tenderness to palpation. Hematological/Lymphatic/Immunilogical: No cervical lymphadenopathy. Cardiovascular: Normal rate,  regular rhythm. Grossly normal heart sounds.  Good peripheral circulation. Respiratory: Normal respiratory effort.  No retractions. Lungs CTAB. Gastrointestinal: Soft and nontender. No distention. No abdominal bruits. No CVA tenderness. Genitourinary: Deferred Musculoskeletal: No lower extremity tenderness nor edema.  No joint effusions. Neurologic:  Normal speech and language. No gross focal neurologic deficits are appreciated. No gait instability. Skin:  Skin is warm, dry and intact. No rash noted. Psychiatric: Mood and affect are normal. Speech and behavior are normal.  ____________________________________________   LABS _           Component Ref Range & Units 9 d ago (06/08/22) 2 mo ago (03/30/22) 1 yr ago (06/17/21) 1 yr ago (09/25/20) 2 yr ago (08/18/19) 2 yr ago (08/08/19) 2 yr ago (08/08/19)  Color, UA  yellow yellow amber dark yellow Amber Yellow Yellow R  Clarity, UA  clear cloudy clear cloudy Cloudy Clear   Glucose, UA Negative Negative Negative Negative Negative Negative Negative   Bilirubin, UA  neg neg negative negative Negative Negative Negative R  Ketones, UA  neg neg negative negative Negative Negative Negative R  Spec Grav, UA 1.010 - 1.025 >=1.030 Abnormal  1.015 1.015 >=1.030 Abnormal  1.025 >=1.030 Abnormal  1.026 R  Blood, UA  neg neg negative negative Negative Positve CM Negative R  pH, UA 5.0 - 8.0 6.0 6.0 6.0 6.0 6.0 5.5 5.0 R  Protein, UA Negative Negative Negative Positive Abnormal  CM Positive Abnormal  CM Negative Negative Negative R  Urobilinogen, UA 0.2 or 1.0 E.U./dL 0.2 0.2 0.2 0.2 0.2 0.2   Nitrite, UA  neg neg negative negative Negative Negative Negative R  Leukocytes, UA Negative Negative Moderate (2+) Abnormal  Negative Negative Negative Negative Negative  Appearance    scant dark     Odor      Positive VC    Resulting Agency        [facility]         Specimen Collected: 06/08/22 08:33 Last Resulted: 06/08/22 08:33      Lab Flowsheet      Order  Details      View Encounter      Lab and Collection Details      Routing      Result History    View All Conversations on this Encounter      VC=Value has a corrected status   CM=Additional comments  R=Reference range differs from displayed range      Result Care Coordination   Patient Communication   Add Comments   Seen Back to Top      Other Results from 06/08/2022   Contains abnormal data CMP12+LP+TP+TSH+6AC+CBC/D/Plt Order: [id number] Status: Final result      Visible to patient: Yes (seen)      Next appt: 09/15/2022 at 08:15 AM in [facility] ([facility])      Dx: Routine adult health maintenance    0 Result Notes              Component Ref Range & Units 9 d ago (06/08/22) 8 mo ago (09/24/21) 1 yr ago (06/17/21) 1 yr ago (03/24/21) 1 yr ago (03/24/21)  1 yr ago (03/24/21) 1 yr ago (11/26/20)  Glucose 70 - 99 mg/dL 91  86 105 High  CM     Uric Acid 3.0 - 7.2 mg/dL 6.3  5.5 R, CM      Comment:            Therapeutic target for gout patients: <6.0  BUN 6 - 24 mg/dL 14  13 14 $ R     Creatinine, Ser 0.57 - 1.00 mg/dL 0.62  0.77 0.56 R     eGFR >59 mL/min/1.73 108  94      BUN/Creatinine Ratio 9 - 23 23  17      $ Sodium 134 - 144 mmol/L 140  141 139 R     Potassium 3.5 - 5.2 mmol/L 4.6  4.7 4.0 R     Chloride 96 - 106 mmol/L 105  102 105 R     Calcium 8.7 - 10.2 mg/dL 9.4  9.9 9.2 R     Phosphorus 3.0 - 4.3 mg/dL 3.7  4.0      Total Protein 6.0 - 8.5 g/dL 7.0  7.3 7.8 R     Albumin 3.8 - 4.9 g/dL 4.5  4.8 R 4.4 R     Globulin, Total 1.5 - 4.5 g/dL 2.5  2.5      Albumin/Globulin Ratio 1.2 - 2.2 1.8  1.9      Bilirubin Total 0.0 - 1.2 mg/dL 0.7  0.8 1.1 R     Alkaline Phosphatase 44 - 121 IU/L 84  104 74 R     LDH 119 - 226 IU/L 151  168      AST 0 - 40 IU/L 17  23 33 R     ALT 0 - 32 IU/L 8  11 15 $ R     GGT 0 - 60 IU/L 31  55      Iron 27 - 159 ug/dL 99  86      Cholesterol, Total 100 - 199 mg/dL 227 High  218 High  237 High       Triglycerides 0 - 149  mg/dL 137 132 128      HDL >39 mg/dL 67 59 64      VLDL Cholesterol Cal 5 - 40 mg/dL 24 23 23      $ LDL Chol Calc (NIH) 0 - 99 mg/dL 136 High  136 High  150 High       Chol/HDL Ratio 0.0 - 4.4 ratio 3.4 3.7 CM 3.7 CM      Comment:                                   T. Chol/HDL Ratio                                             Men  Women                               1/2 Avg.Risk  3.4    3.3                                   Avg.Risk  5.0    4.4  2X Avg.Risk  9.6    7.1                                3X Avg.Risk 23.4   11.0  Estimated CHD Risk 0.0 - 1.0 times avg. 0.5  0.7 CM      Comment: The CHD Risk is based on the T. Chol/HDL ratio. Other factors affect CHD Risk such as hypertension, smoking, diabetes, severe obesity, and family history of premature CHD.  TSH 0.450 - 4.500 uIU/mL 1.170  1.560      T4, Total 4.5 - 12.0 ug/dL 7.9  7.1      T3 Uptake Ratio 24 - 39 % 26  25      Free Thyroxine Index 1.2 - 4.9 2.1  1.8      WBC 3.4 - 10.8 x10E3/uL 6.3  7.8   7.7 R 10.2 R  RBC 3.77 - 5.28 x10E6/uL 5.06  5.28   5.35 High  R 4.61 R  Hemoglobin 11.1 - 15.9 g/dL 14.5  15.1   15.7 High  R 13.4 R  Hematocrit 34.0 - 46.6 % 44.6  45.6   47.9 High  R 41.9 R  MCV 79 - 97 fL 88  86   89.5 R 90.9 R  MCH 26.6 - 33.0 pg 28.7  28.6   29.3 R 29.1 R  MCHC 31.5 - 35.7 g/dL 32.5  33.1   32.8 R 32.0 R  RDW 11.7 - 15.4 % 12.4  12.3   12.7 R 12.3 R  Platelets 150 - 450 x10E3/uL 248  265   231 R 240 R  Neutrophils Not Estab. % 59  64  59 R  87 R  Lymphs Not Estab. % 30  27      Monocytes Not Estab. % 8  6      Eos Not Estab. % 2  2      Basos Not Estab. % 1  1      Neutrophils Absolute 1.4 - 7.0 x10E3/uL 3.8  5.0  4.5 R  8.9 High  R  Lymphocytes Absolute 0.7 - 3.1 x10E3/uL 1.9  2.1  2.2 R  0.9 R  Monocytes Absolute 0.1 - 0.9 x10E3/uL 0.5  0.5      EOS (ABSOLUTE) 0.0 - 0.4 x10E3/uL 0.1  0.2      Basophils Absolute 0.0 - 0.2 x10E3/uL 0.0  0.1  0.1 R  0.0 R  Immature  Granulocytes Not Estab. % 0  0  0 R  1 R                ___________________________________________  EKG  Sinus rhythm 84 bpm ____________________________________________   ____________________________________________   INITIAL IMPRESSION / ASSESSMENT AND PLAN / ED COURSE  As part of my medical decision making, I reviewed the following data within the electronic MEDICAL RECORD NUMBER       No acute findings on physical exam.  Patient will restart Crestor and follow-up fasting lipid profile in 3 months.      ____________________________________________   FINAL CLINICAL IMPRESSION Well exam   ED Discharge Orders          Ordered    diclofenac Sodium (VOLTAREN ARTHRITIS PAIN) 1 % GEL  4 times daily        06/17/22 0826             Note:  This document was prepared using Dragon voice  recognition software and may include unintentional dictation errors.

## 2022-06-17 NOTE — Progress Notes (Signed)
Pt presents today to complete physical, no concerns or issues at this time. [name]

## 2022-06-19 ENCOUNTER — Encounter (HOSPITAL_COMMUNITY): Payer: Self-pay | Admitting: *Deleted

## 2022-07-15 ENCOUNTER — Other Ambulatory Visit: Payer: Self-pay

## 2022-07-21 ENCOUNTER — Ambulatory Visit: Payer: Self-pay

## 2022-07-21 VITALS — BP 124/70 | HR 94 | Temp 97.8°F | Resp 14

## 2022-07-21 DIAGNOSIS — R42 Dizziness and giddiness: Secondary | ICD-10-CM

## 2022-07-21 DIAGNOSIS — E162 Hypoglycemia, unspecified: Secondary | ICD-10-CM

## 2022-07-21 LAB — POCT GLYCOSYLATED HEMOGLOBIN (HGB A1C): Hemoglobin A1C: 5.6 % (ref 4.0–5.6)

## 2022-07-21 LAB — GLUCOSE, POCT (MANUAL RESULT ENTRY): POC Glucose: 64 mg/dl — AB (ref 70–99)

## 2022-07-21 NOTE — Progress Notes (Signed)
Stated she was dizzy and sweaty and feeling like blood sugar was low.  No dyspnea or any other s/s of illness.  She had not eaten lunch today and report family hx diabetes.  CBG taken and 64.  A1C checked with family hx.  Given snacks, protein and fluids with electrolytes.  Advised to eat at regular intervals and evaluate protein intake and schedule at [facility] with provider if con't issues.  Reported feeling back to baseline after snacks and rest.

## 2022-08-17 ENCOUNTER — Encounter: Payer: Self-pay | Admitting: Physician Assistant

## 2022-08-17 ENCOUNTER — Ambulatory Visit: Payer: Self-pay | Admitting: Physician Assistant

## 2022-08-17 DIAGNOSIS — W57XXXA Bitten or stung by nonvenomous insect and other nonvenomous arthropods, initial encounter: Secondary | ICD-10-CM

## 2022-08-17 MED ORDER — DOXYCYCLINE MONOHYDRATE 100 MG PO CAPS: 100.0000 mg | ORAL_CAPSULE | Freq: Two times a day (BID) | ORAL | 0 refills | Status: DC

## 2022-08-17 NOTE — Progress Notes (Signed)
   Subjective: Tick bite    Patient ID: [name], female    DOB: 24-Dec-1970, 52 y.o.   MRN: [id number]  HPI Patient presents tick bite to the left lateral abdomen.  Tick was fairly point in awakening.  [place]  Denies fever associated complaint.  Patient has to take intact and lab showing a white spot on the back.   Review of Systems Negative septa above complaint    Objective:   Physical Exam  Deferred secondary to this being a telephonic encounter      Assessment & Plan: Tick bite   Patient given doxycycline as prophylactic and will follow-up as needed.  Labs were deferred.

## 2022-09-01 ENCOUNTER — Encounter: Payer: Self-pay | Admitting: Physician Assistant

## 2022-09-01 ENCOUNTER — Ambulatory Visit: Payer: Self-pay | Admitting: Physician Assistant

## 2022-09-01 VITALS — BP 112/78 | HR 106 | Temp 98.6°F | Resp 16 | Ht 68.0 in | Wt 227.0 lb

## 2022-09-01 DIAGNOSIS — R5383 Other fatigue: Secondary | ICD-10-CM

## 2022-09-01 DIAGNOSIS — M436 Torticollis: Secondary | ICD-10-CM

## 2022-09-01 MED ORDER — TRAMADOL HCL 50 MG PO TABS: 50.0000 mg | ORAL_TABLET | Freq: Three times a day (TID) | ORAL | 0 refills | Status: AC | PRN

## 2022-09-01 MED ORDER — METHYLPREDNISOLONE 4 MG PO TBPK: ORAL_TABLET | ORAL | 0 refills | Status: DC

## 2022-09-01 MED ORDER — METHOCARBAMOL 750 MG PO TABS: 750.0000 mg | ORAL_TABLET | Freq: Four times a day (QID) | ORAL | 0 refills | Status: DC

## 2022-09-01 NOTE — Progress Notes (Signed)
   Subjective: Obesity, fatigue, and joint pain.    Patient ID: [name], female    DOB: 01/01/1971, 52 y.o.   MRN: [id number]  HPI Patient presents for prescription for [facility] compounded for obesity.  Patient also states right lateral neck pain with decreased range of motion of lateral movements.  Patient also states radicular component to the right upper extremity.  Onset 2 days ago upon a.m. awakening.   Review of Systems Asthma, GERD, osteoarthritis, and morbid obesity.    Objective:   Physical Exam  BP 112/78  Pulse 106  Resp 16  Temp 98.6 F (37 C)  Temp src Temporal  SpO2 97 %  Weight 227 lb (103 kg)  Height 5\' 8"  (1.727 m)   BMI 34.52 kg/m2  BSA 2.22 m2  Mild distress.  No cervical deformity.  Decreased range of motion of the cervical spine with lateral movements.  Upper extremities are neurovascularly intact.     Assessment & Plan: Obesity and torticollis.  Patient given prescription for [facility].  Patient will do 0.25 mg weekly for 4 weeks.  Patient will follow-up in 4 weeks.  Patient also given a prescription for Robaxin, tramadol, and Medrol Dosepak for torticollis.

## 2022-09-01 NOTE — Progress Notes (Signed)
Pt presents today for weight loss and right shoulder pain, pt states the pain is always constant but this past Sunday it has gotten worse. Right shoulder pain up radiating to neck and top of shoulder.[name]

## 2022-09-02 DIAGNOSIS — Z9884 Bariatric surgery status: Secondary | ICD-10-CM | POA: Diagnosis not present

## 2022-09-02 DIAGNOSIS — M2550 Pain in unspecified joint: Secondary | ICD-10-CM | POA: Diagnosis not present

## 2022-09-02 LAB — VITAMIN D 25 HYDROXY (VIT D DEFICIENCY, FRACTURES): Vit D, 25-Hydroxy: 30.1 ng/mL (ref 30.0–100.0)

## 2022-09-02 LAB — B12 AND FOLATE PANEL
Folate: 13.1 ng/mL (ref >3.0)
Vitamin B-12: 620 pg/mL (ref 232–1245)

## 2022-09-03 DIAGNOSIS — M2550 Pain in unspecified joint: Secondary | ICD-10-CM | POA: Diagnosis not present

## 2022-09-13 ENCOUNTER — Other Ambulatory Visit: Payer: Self-pay | Admitting: Physician Assistant

## 2022-09-14 ENCOUNTER — Other Ambulatory Visit: Payer: 59

## 2022-09-14 DIAGNOSIS — E785 Hyperlipidemia, unspecified: Secondary | ICD-10-CM

## 2022-09-15 ENCOUNTER — Other Ambulatory Visit: Payer: 59

## 2022-09-15 LAB — LIPID PANEL
Chol/HDL Ratio: 3.4 ratio (ref 0.0–4.4)
Cholesterol, Total: 202 mg/dL — ABNORMAL HIGH (ref 100–199)
HDL: 59 mg/dL (ref >39)
LDL Chol Calc (NIH): 115 mg/dL — ABNORMAL HIGH (ref 0–99)
Triglycerides: 161 mg/dL — ABNORMAL HIGH (ref 0–149)
VLDL Cholesterol Cal: 28 mg/dL (ref 5–40)

## 2022-09-22 ENCOUNTER — Other Ambulatory Visit: Payer: Self-pay | Admitting: Physician Assistant

## 2022-09-22 DIAGNOSIS — Z1231 Encounter for screening mammogram for malignant neoplasm of breast: Secondary | ICD-10-CM

## 2022-10-13 ENCOUNTER — Ambulatory Visit: Payer: Self-pay | Admitting: Physician Assistant

## 2022-10-13 DIAGNOSIS — T148XXA Other injury of unspecified body region, initial encounter: Secondary | ICD-10-CM

## 2022-10-13 NOTE — Progress Notes (Signed)
[facility] [address], [place] [address]   Office Visit Note  Patient Name: [name]  [id number]  [id number]  Date of Service: 10/13/2022  No chief complaint on file.    HPI Pt is here for right foot splinter.  She believes that she slid her foot against the linoleum floor, resulting in a splinter that occurred approximately 2 weeks ago (right foot, plantar surface as indicated on diagram below).  Since then, she has had pain and believes she can feel a splinter in her foot; however, she has been unable to remove the splinter herself on attempts.  She had some pus/purulent drainage occurring recently in the area of the splinter -a coworker helped her drain this with some relief of her pain.     Current Medication:  Outpatient Encounter Medications as of 10/13/2022  Medication Sig   albuterol (PROVENTIL) (2.5 MG/3ML) 0.083% nebulizer solution VVN QID PRN   ALPRAZolam (XANAX) 0.25 MG tablet Take 1 tablet (0.25 mg total) by mouth at bedtime.   diclofenac Sodium (VOLTAREN ARTHRITIS PAIN) 1 % GEL Apply 2 g topically 4 (four) times daily.   doxycycline (MONODOX) 100 MG capsule Take 1 capsule (100 mg total) by mouth 2 (two) times daily.   estradiol (ESTRACE) 0.5 MG tablet Take 0.5 mg by mouth.   methocarbamol (ROBAXIN-750) 750 MG tablet Take 1 tablet (750 mg total) by mouth 4 (four) times daily.   methylPREDNISolone (MEDROL DOSEPAK) 4 MG TBPK tablet Take Tapered dose as directed   pantoprazole (PROTONIX) 40 MG tablet Take 40 mg by mouth 2 (two) times daily.   rosuvastatin (CRESTOR) 40 MG tablet Take 1 tablet (40 mg total) by mouth daily. (Patient not taking: Reported on 04/23/2022)   No facility-administered encounter medications on file as of 10/13/2022.      Medical History: Past Medical History:  Diagnosis Date   Complication of anesthesia    Elevated lipids    GERD (gastroesophageal reflux disease)    Obesity    PONV (postoperative nausea and  vomiting)      Vital Signs: There were no vitals taken for this visit.   ROS negative unless otherwise indicated above  Physical Exam Musculoskeletal:       Feet:  Feet:     Comments: No purulent drainage observed on my exam.  Right foot plantar surface splinter.      Assessment/Plan:  Using headset / magnification, tweezers, and needle, I was able to remove what appeared to be very small remains / parts of a remainder of the splinter.  Patient reported alleviation of pain.  Area of foot was then cleaned and bandaged.  General Counseling: [name] verbalizes understanding of the findings of todays visit and agrees with plan of treatment. I have discussed any further diagnostic evaluation that may be needed or ordered today. We also reviewed her medications today. she has been encouraged to call the office with any questions or concerns that should arise related to todays visit.   No orders of the defined types were placed in this encounter.   No orders of the defined types were placed in this encounter.       Marisue Ivan, PA-C

## 2022-11-15 DIAGNOSIS — M25572 Pain in left ankle and joints of left foot: Secondary | ICD-10-CM | POA: Diagnosis not present

## 2022-11-15 DIAGNOSIS — M7989 Other specified soft tissue disorders: Secondary | ICD-10-CM | POA: Diagnosis not present

## 2022-11-15 DIAGNOSIS — S99922A Unspecified injury of left foot, initial encounter: Secondary | ICD-10-CM | POA: Diagnosis not present

## 2022-11-15 DIAGNOSIS — M25571 Pain in right ankle and joints of right foot: Secondary | ICD-10-CM | POA: Diagnosis not present

## 2022-11-15 DIAGNOSIS — L03032 Cellulitis of left toe: Secondary | ICD-10-CM | POA: Diagnosis not present

## 2022-11-15 DIAGNOSIS — S99911A Unspecified injury of right ankle, initial encounter: Secondary | ICD-10-CM | POA: Diagnosis not present

## 2022-11-16 ENCOUNTER — Ambulatory Visit
Admission: RE | Admit: 2022-11-16 | Discharge: 2022-11-16 | Disposition: A | Discharge status: Home / Self Care | Payer: 59 | Source: Ambulatory Visit | Attending: Physician Assistant | Admitting: Physician Assistant

## 2022-11-16 DIAGNOSIS — Z1231 Encounter for screening mammogram for malignant neoplasm of breast: Secondary | ICD-10-CM | POA: Diagnosis not present

## 2022-11-28 IMAGING — RF DG UGI W/ HIGH DENSITY W/O KUB
12 of 20 series · 14 of 24 positions shown · non-contrast
Comparison: None.

CLINICAL DATA: Morbid obesity.  Preop gastric bypass surgery.

EXAM:
UPPER GI SERIES WITH KUB
TECHNIQUE: After obtaining a scout radiograph a routine upper GI series was
performed using thin and high density barium.
FLUOROSCOPY TIME:  Fluoroscopy Time:  0.8 minute
Radiation Exposure Index (if provided by the fluoroscopic device):
26.6
Number of Acquired Spot Images: 1

[Series 1: t abdomen supine · 0.15mm/px · 1 of 1 slices shown]
[im 1/1]
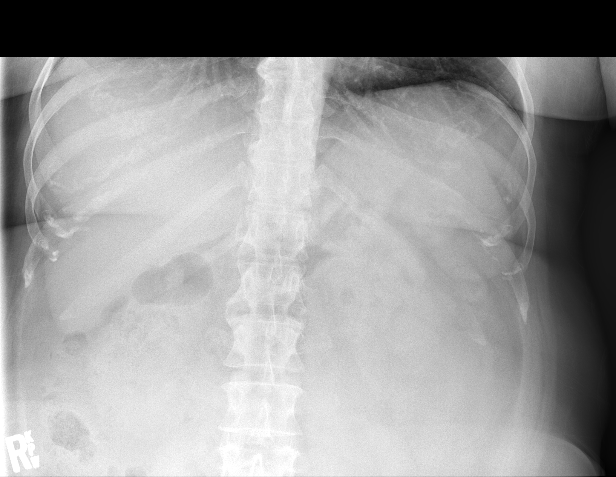

[Series 3: cp_standard · 0.25mm/px · 2 of 29 frames shown (1 of 11)]
[frame 5/29]
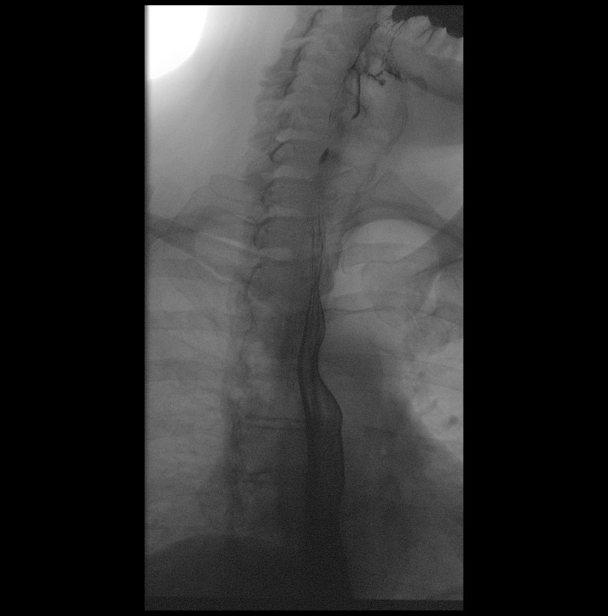
[frame 25/29]
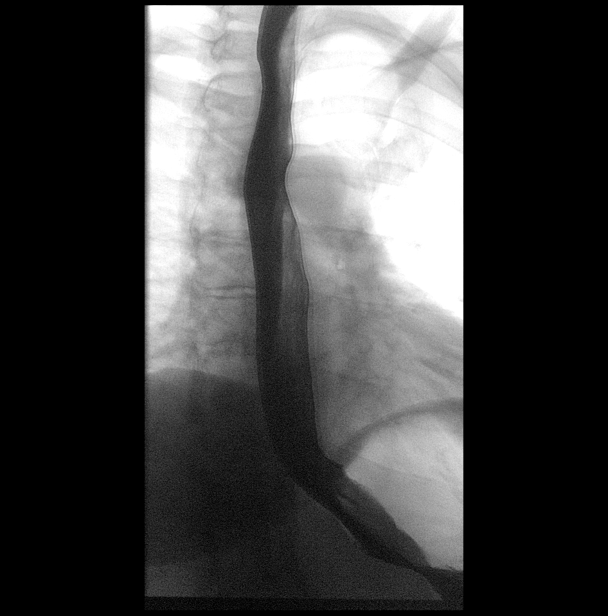

[Series 5: cp_standard · 0.25mm/px · 1 of 1 slices shown (2 of 11)]
[im 1/1]
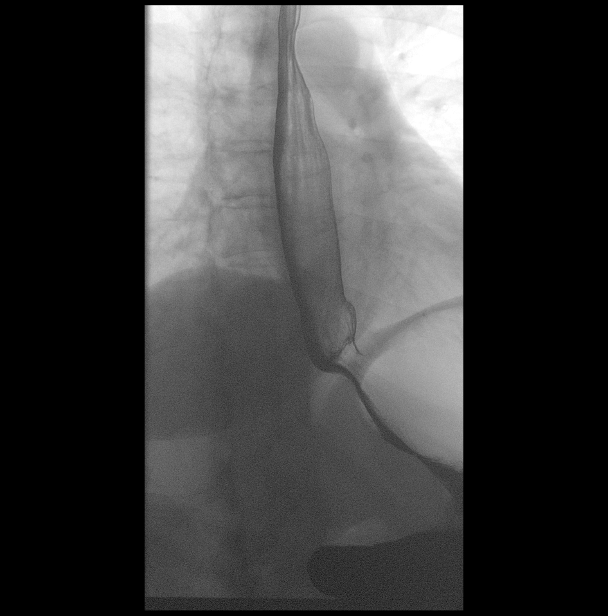

[Series 7: cp_standard · 0.27mm/px · 1 of 1 slices shown (3 of 11)]
[im 1/1]
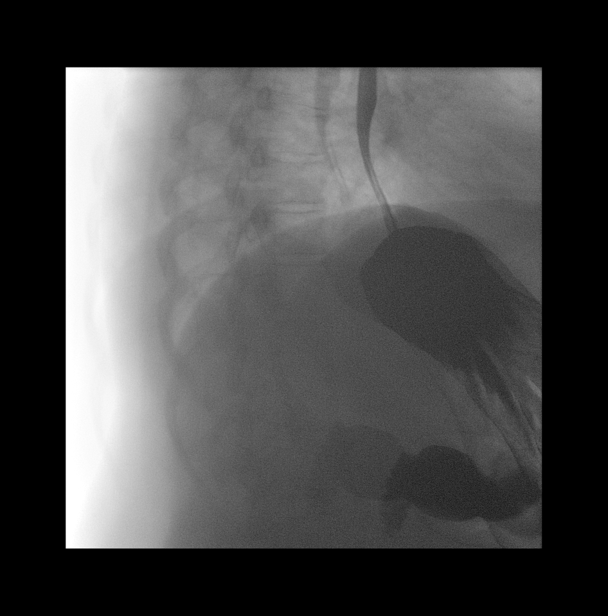

[Series 9: cp_standard · 0.28mm/px · 1 of 1 slices shown (4 of 11)]
[im 1/1]
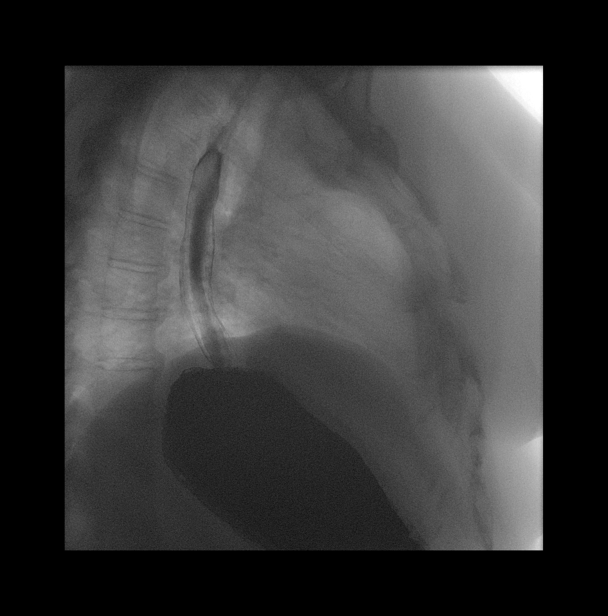

[Series 11: cp_standard · 0.28mm/px · 1 of 1 slices shown (5 of 11)]
[im 1/1]
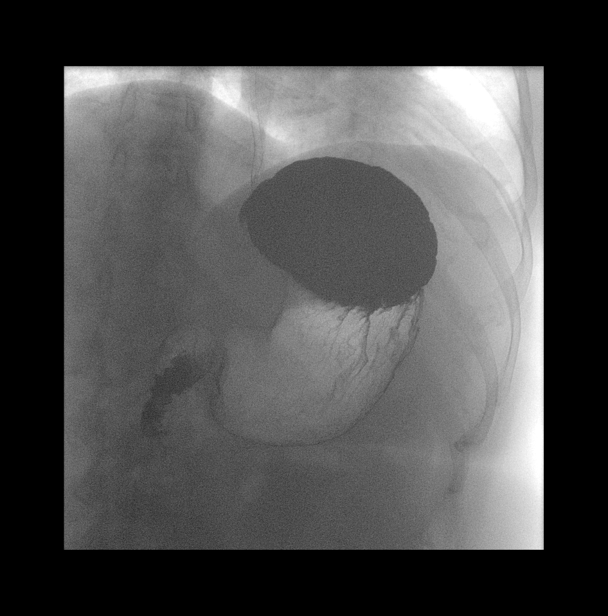

[Series 13: cp_standard · 0.28mm/px · 1 of 1 slices shown (6 of 11)]
[im 1/1]
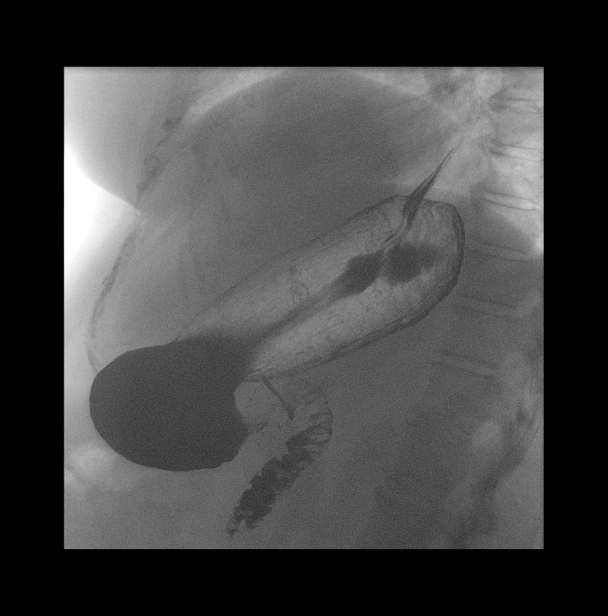

[Series 15: cp_standard · 0.19mm/px · 1 of 1 slices shown (7 of 11)]
[im 1/1]
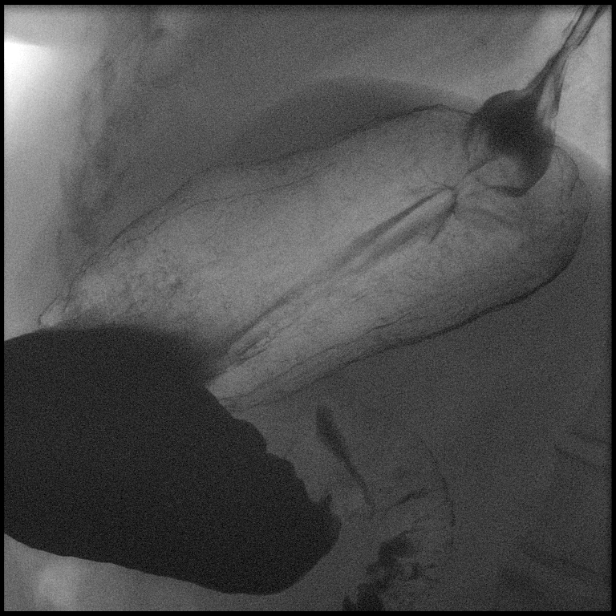

[Series 17: cp_standard · 0.19mm/px · 1 of 1 slices shown (8 of 11)]
[im 1/1]
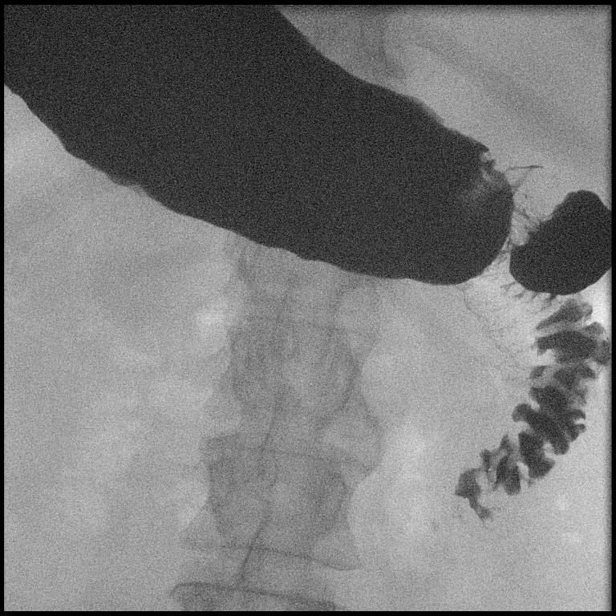

[Series 19: cp_standard · 0.19mm/px · 2 of 3 frames shown (9 of 11)]
[frame 2/3]
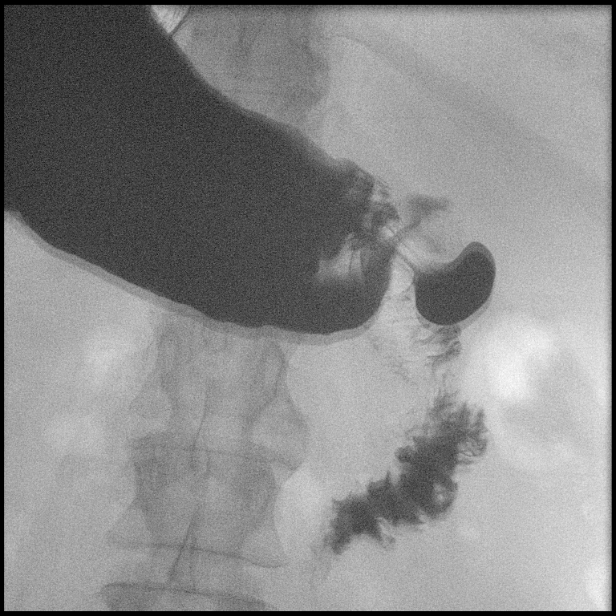
[frame 3/3]
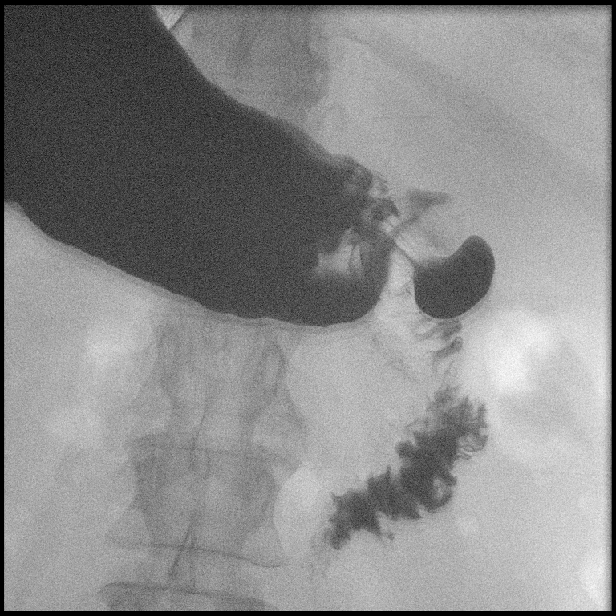

[Series 22: cp_standard · 0.28mm/px · 1 of 1 slices shown (10 of 11)]
[im 1/1]
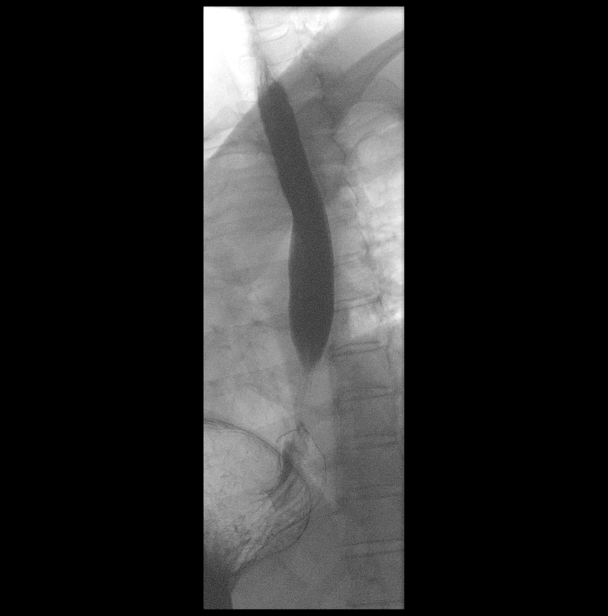

[Series 24: cp_standard · 0.28mm/px · 1 of 1 slices shown (11 of 11)]
[im 1/1]
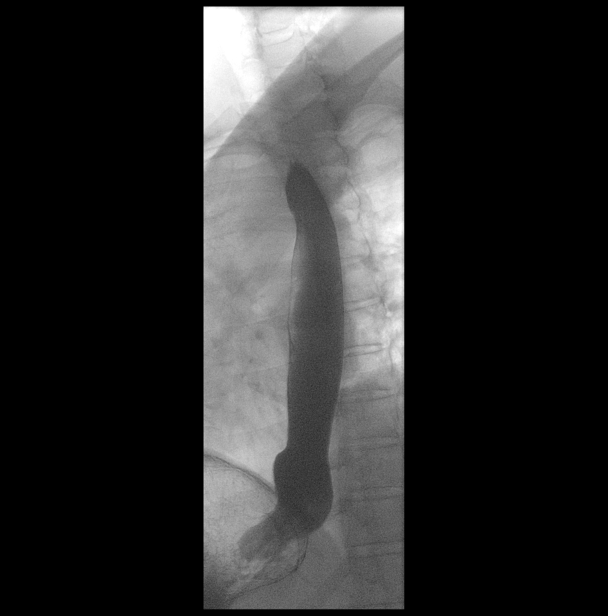

[14 of 24 positions shown; findings below may reference images not displayed]

FINDINGS: KUB:

No bowel dilatation to suggest obstruction. No evidence of
pneumoperitoneum, portal venous gas or pneumatosis.

No pathologic calcifications along the expected course of the
ureters.

No acute osseous abnormality.

UPPER GI SERIES:

Examination of the esophagus demonstrated normal esophageal
motility. Normal esophageal morphology without evidence of
esophagitis or ulceration. No esophageal stricture, diverticula, or
mass lesion. No evidence of hiatal hernia. Severe gastroesophageal
reflux.

Examination of the stomach demonstrated normal rugal folds and areae
gastricae. The gastric mucosa appeared unremarkable without evidence
of ulceration, scarring, or mass lesion. Gastric motility and
emptying was normal. Fluoroscopic examination of the duodenum
demonstrates normal motility and morphology without evidence of
ulceration or mass lesion.
IMPRESSION: 1. Severe gastroesophageal reflux.
2. Otherwise normal upper GI.

## 2022-12-17 ENCOUNTER — Other Ambulatory Visit: Payer: Self-pay

## 2022-12-17 DIAGNOSIS — K219 Gastro-esophageal reflux disease without esophagitis: Secondary | ICD-10-CM

## 2022-12-17 MED ORDER — PANTOPRAZOLE SODIUM 40 MG PO TBEC
40.0000 mg | DELAYED_RELEASE_TABLET | Freq: Two times a day (BID) | ORAL | 3 refills | Status: AC
Start: 2022-12-17 — End: ?

## 2023-03-03 ENCOUNTER — Other Ambulatory Visit: Payer: Self-pay

## 2023-03-03 DIAGNOSIS — D649 Anemia, unspecified: Secondary | ICD-10-CM

## 2023-03-03 NOTE — Progress Notes (Signed)
Davita tried to donate blood yesterday at [facility] sponsored blood drive & wasn't able to donate because Hgb was too low.  States blood drive personnel told her it was a 10.  Requesting to have Hgb & Iron levels checked.  S/P hysterectomy   AMD

## 2023-03-04 LAB — CBC WITH DIFFERENTIAL/PLATELET
Basophils Absolute: 0 10*3/uL (ref 0.0–0.2)
Basos: 1 %
EOS (ABSOLUTE): 0.2 10*3/uL (ref 0.0–0.4)
Eos: 3 %
Hematocrit: 43.7 % (ref 34.0–46.6)
Hemoglobin: 14 g/dL (ref 11.1–15.9)
Immature Grans (Abs): 0 10*3/uL (ref 0.0–0.1)
Immature Granulocytes: 0 %
Lymphocytes Absolute: 2 10*3/uL (ref 0.7–3.1)
Lymphs: 30 %
MCH: 28.5 pg (ref 26.6–33.0)
MCHC: 32 g/dL (ref 31.5–35.7)
MCV: 89 fL (ref 79–97)
Monocytes Absolute: 0.5 10*3/uL (ref 0.1–0.9)
Monocytes: 8 %
Neutrophils Absolute: 3.9 10*3/uL (ref 1.4–7.0)
Neutrophils: 58 %
Platelets: 278 10*3/uL (ref 150–450)
RBC: 4.91 x10E6/uL (ref 3.77–5.28)
RDW: 11.8 % (ref 11.7–15.4)
WBC: 6.6 10*3/uL (ref 3.4–10.8)

## 2023-03-04 LAB — IRON,TIBC AND FERRITIN PANEL
Ferritin: 39 ng/mL (ref 15–150)
Iron Saturation: 13 % — ABNORMAL LOW (ref 15–55)
Iron: 51 ug/dL (ref 27–159)
Total Iron Binding Capacity: 383 ug/dL (ref 250–450)
UIBC: 332 ug/dL (ref 131–425)

## 2023-04-27 ENCOUNTER — Encounter: Payer: Self-pay | Admitting: Physician Assistant

## 2023-04-27 ENCOUNTER — Ambulatory Visit: Payer: Self-pay | Admitting: Physician Assistant

## 2023-04-27 DIAGNOSIS — H8112 Benign paroxysmal vertigo, left ear: Secondary | ICD-10-CM

## 2023-04-27 MED ORDER — MECLIZINE HCL 25 MG PO TABS: 25.0000 mg | ORAL_TABLET | Freq: Three times a day (TID) | ORAL | 0 refills | Status: DC | PRN

## 2023-04-27 NOTE — Progress Notes (Signed)
   Subjective: Vertigo and tinnitus    Patient ID: [name], female    DOB: 12-17-1970, 52 y.o.   MRN: [id number]  HPI Patient presents with onset of vertigo with position changes.  Patient states prior to the vertigo she felt a ringing in her left ear.  Patient states the ringing has decreased but the vertigo has increased.  Denies hearing loss.   Review of Systems Asthma and GERD    Objective:   Physical Exam HEENT is grossly unremarkable. Neck is supple lymphadenopathy or bruits. Heart is regular rate and rhythm. Mild Romberg        Assessment & Plan: Benign positional vertigo  Patient given a prescription for Antivert  and advised to follow-up if no improvement or worsening complaint

## 2023-04-27 NOTE — Progress Notes (Signed)
 Pt presents today for left ear ringing when waking up this morning. Pt also feeling dizzy when standing up to fast. When getting up this morning she said she felt like her ear filled up with fluid or wax build up.  Both of patients ears are free from wax, no build up/ impaction.

## 2023-05-19 ENCOUNTER — Other Ambulatory Visit: Payer: Self-pay | Admitting: Medical Genetics

## 2023-06-10 ENCOUNTER — Ambulatory Visit: Payer: Self-pay

## 2023-06-10 DIAGNOSIS — Z0000 Encounter for general adult medical examination without abnormal findings: Secondary | ICD-10-CM

## 2023-06-10 LAB — POCT URINALYSIS DIPSTICK
Bilirubin, UA: POSITIVE
Blood, UA: POSITIVE
Glucose, UA: NEGATIVE
Ketones, UA: NEGATIVE
Leukocytes, UA: NEGATIVE
Nitrite, UA: NEGATIVE
Protein, UA: POSITIVE — AB
Spec Grav, UA: 1.025 (ref 1.010–1.025)
Urobilinogen, UA: 0.2 U/dL
pH, UA: 5 (ref 5.0–8.0)

## 2023-06-11 LAB — CMP12+LP+TP+TSH+6AC+CBC/D/PLT
ALT: 10 [IU]/L (ref 0–32)
AST: 21 [IU]/L (ref 0–40)
Albumin: 4.6 g/dL (ref 3.8–4.9)
Alkaline Phosphatase: 99 [IU]/L (ref 44–121)
BUN/Creatinine Ratio: 24 — ABNORMAL HIGH (ref 9–23)
BUN: 15 mg/dL (ref 6–24)
Basophils Absolute: 0.1 10*3/uL (ref 0.0–0.2)
Basos: 1 %
Bilirubin Total: 0.5 mg/dL (ref 0.0–1.2)
Calcium: 9.4 mg/dL (ref 8.7–10.2)
Chloride: 103 mmol/L (ref 96–106)
Chol/HDL Ratio: 3.5 {ratio} (ref 0.0–4.4)
Cholesterol, Total: 226 mg/dL — ABNORMAL HIGH (ref 100–199)
Creatinine, Ser: 0.62 mg/dL (ref 0.57–1.00)
EOS (ABSOLUTE): 0.2 10*3/uL (ref 0.0–0.4)
Eos: 3 %
Estimated CHD Risk: 0.6 times avg. (ref 0.0–1.0)
Free Thyroxine Index: 1.8 (ref 1.2–4.9)
GGT: 37 [IU]/L (ref 0–60)
Globulin, Total: 2.3 g/dL (ref 1.5–4.5)
Glucose: 82 mg/dL (ref 70–99)
HDL: 65 mg/dL (ref >39)
Hematocrit: 44.6 % (ref 34.0–46.6)
Hemoglobin: 14.6 g/dL (ref 11.1–15.9)
Immature Grans (Abs): 0 10*3/uL (ref 0.0–0.1)
Immature Granulocytes: 0 %
Iron: 80 ug/dL (ref 27–159)
LDH: 179 [IU]/L (ref 119–226)
LDL Chol Calc (NIH): 143 mg/dL — ABNORMAL HIGH (ref 0–99)
Lymphocytes Absolute: 1.8 10*3/uL (ref 0.7–3.1)
Lymphs: 29 %
MCH: 28.9 pg (ref 26.6–33.0)
MCHC: 32.7 g/dL (ref 31.5–35.7)
MCV: 88 fL (ref 79–97)
Monocytes Absolute: 0.5 10*3/uL (ref 0.1–0.9)
Monocytes: 7 %
Neutrophils Absolute: 3.7 10*3/uL (ref 1.4–7.0)
Neutrophils: 60 %
Phosphorus: 3.4 mg/dL (ref 3.0–4.3)
Platelets: 247 10*3/uL (ref 150–450)
Potassium: 4.5 mmol/L (ref 3.5–5.2)
RBC: 5.05 x10E6/uL (ref 3.77–5.28)
RDW: 12.6 % (ref 11.7–15.4)
Sodium: 142 mmol/L (ref 134–144)
T3 Uptake Ratio: 24 % (ref 24–39)
T4, Total: 7.5 ug/dL (ref 4.5–12.0)
TSH: 1.4 u[IU]/mL (ref 0.450–4.500)
Total Protein: 6.9 g/dL (ref 6.0–8.5)
Triglycerides: 101 mg/dL (ref 0–149)
Uric Acid: 6.3 mg/dL (ref 3.0–7.2)
VLDL Cholesterol Cal: 18 mg/dL (ref 5–40)
WBC: 6.2 10*3/uL (ref 3.4–10.8)
eGFR: 107 mL/min/{1.73_m2} (ref >59)

## 2023-06-16 ENCOUNTER — Encounter: Payer: Self-pay | Admitting: Physician Assistant

## 2023-06-16 ENCOUNTER — Ambulatory Visit: Payer: Self-pay | Admitting: Physician Assistant

## 2023-06-16 VITALS — BP 112/73 | HR 114 | Temp 97.6°F | Resp 12 | Ht 68.0 in | Wt 234.0 lb

## 2023-06-16 DIAGNOSIS — Z0000 Encounter for general adult medical examination without abnormal findings: Secondary | ICD-10-CM

## 2023-06-16 NOTE — Progress Notes (Signed)
 Pt presents today to complete physical, Pt didn't voice any concerns at this time [name]

## 2023-06-16 NOTE — Progress Notes (Signed)
[facility] ____________________________________________   None    (approximate)  I have reviewed the triage vital signs and the nursing notes.   HISTORY  Chief Complaint No chief complaint on file.  { HPI [name] is a 53 y.o. female patient presents for annual physical exam.         Past Medical History:  Diagnosis Date   Complication of anesthesia    Elevated lipids    GERD (gastroesophageal reflux disease)    Obesity    PONV (postoperative nausea and vomiting)     Patient Active Problem List   Diagnosis Date Noted   Constipation 09/26/2021   Status post sleeve gastrectomy 11/25/2020   Mild intermittent asthma without complication 08/23/2020   Primary osteoarthritis involving multiple joints 08/21/2019   Morbid obesity (HCC) 03/06/2019   Pain in joint, shoulder region 12/20/2018   Gastroesophageal reflux disease 02/15/2017   S/P laparoscopic cholecystectomy 09/11/2015   Hyperplastic colon polyp 08/30/2015    Past Surgical History:  Procedure Laterality Date   ABDOMINAL HYSTERECTOMY     APPENDECTOMY     CHOLECYSTECTOMY     colonoscopy  2018 or 2019   LAPAROSCOPIC OOPHERECTOMY     UPPER GI ENDOSCOPY  2018 or 2019   UPPER GI ENDOSCOPY N/A 11/25/2020   Procedure: UPPER GI ENDOSCOPY;  Surgeon: Luretha Murphy, MD;  Location: [facility];  Service: General;  Laterality: N/A;    Prior to Admission medications   Medication Sig Start Date End Date Taking? Authorizing Provider  albuterol (PROVENTIL) (2.5 MG/3ML) 0.083% nebulizer solution VVN QID PRN    [provider]  ALPRAZolam (XANAX) 0.25 MG tablet Take 1 tablet (0.25 mg total) by mouth at bedtime. 01/07/22   Joni Reining, PA-C  meclizine (ANTIVERT) 25 MG tablet Take 1 tablet (25 mg total) by mouth 3 (three) times daily as needed for dizziness. 04/27/23   Joni Reining, PA-C  pantoprazole (PROTONIX) 40 MG tablet Take 1 tablet (40 mg total) by mouth 2 (two)  times daily. 12/17/22   Joni Reining, PA-C  rosuvastatin (CRESTOR) 40 MG tablet Take 1 tablet (40 mg total) by mouth daily. Patient not taking: Reported on 04/23/2022 06/23/21   Joni Reining, PA-C    Allergies Patient has no active allergies.  Family History  Problem Relation Age of Onset   Breast cancer Maternal Aunt    Diabetes Mother    Diabetes Maternal Grandfather    Diabetes Maternal Grandmother    Diabetes Paternal Grandmother     Social History Social History   Tobacco Use   Smoking status: Former    Current packs/day: 0.00    Types: Cigarettes    Quit date: 04/2018    Years since quitting: 5.1   Smokeless tobacco: Never  Vaping Use   Vaping status: Never Used  Substance Use Topics   Alcohol use: Yes    Alcohol/week: 0.0 - 2.0 standard drinks of alcohol   Drug use: Never    Review of Systems  Constitutional: No fever/chills Eyes: No visual changes. ENT: No sore throat. Cardiovascular: Denies chest pain. Respiratory: Denies shortness of breath. Gastrointestinal: No abdominal pain.  No nausea, no vomiting.  No diarrhea.  No constipation.  GERD Genitourinary: Negative for dysuria. Musculoskeletal: Negative for back pain. Skin: Negative for rash. Neurological: Negative for headaches, focal weakness or numbness. Endocrine: Hyperlipidemia ____________________________________________   PHYSICAL EXAM:  VITAL SIGNS: BP 112/73  Cuff Size Large  Pulse Rate 114Pulse Rate.  114. Data is abnormal. Taken on 06/16/23 8:59 AM  Temp 97.6 F (36.4 C)  Temp Source Temporal  Weight 234 lb (106.1 kg)  Height 5\' 8"  (1.727 m)  Resp 12  SpO2 98 %   BMI: 35.58 kg/m2  BSA: 2.26 m2   Eyes: Conjunctivae are normal. PERRL. EOMI. Head: Atraumatic. Nose: No congestion/rhinnorhea. Mouth/Throat: Mucous membranes are moist.  Oropharynx non-erythematous. Neck: No stridor.  No cervical spine tenderness to palpation. Hematological/Lymphatic/Immunilogical: No cervical  lymphadenopathy. Cardiovascular: Normal rate, regular rhythm. Grossly normal heart sounds.  Good peripheral circulation. Respiratory: Normal respiratory effort.  No retractions. Lungs CTAB. Gastrointestinal: Soft and nontender. No distention. No abdominal bruits. No CVA tenderness. Genitourinary: Deferred Musculoskeletal: No lower extremity tenderness nor edema.  No joint effusions. Neurologic:  Normal speech and language. No gross focal neurologic deficits are appreciated. No gait instability. Skin:  Skin is warm, dry and intact. No rash noted. Psychiatric: Mood and affect are normal. Speech and behavior are normal.  ____________________________________________   LABS           Component Ref Range & Units (hover) 6 d ago (06/10/23) 1 yr ago (06/08/22) 1 yr ago (03/30/22) 1 yr ago (06/17/21) 2 yr ago (09/25/20) 3 yr ago (08/18/19) 3 yr ago (08/08/19)  Color, UA Light Yellow yellow yellow amber dark yellow Amber Yellow  Clarity, UA Cloudy clear cloudy clear cloudy Cloudy Clear  Glucose, UA Negative Negative Negative Negative Negative Negative Negative  Bilirubin, UA Positive neg neg negative negative Negative Negative  Comment: 1+  Ketones, UA Negative neg neg negative negative Negative Negative  Spec Grav, UA 1.025 >=1.030 Abnormal  1.015 1.015 >=1.030 Abnormal  1.025 >=1.030 Abnormal   Blood, UA Positive neg neg negative negative Negative Positve CM  Comment: 1+  pH, UA 5.0 6.0 6.0 6.0 6.0 6.0 5.5  Protein, UA Positive Abnormal  Negative Negative Positive Abnormal  CM Positive Abnormal  CM Negative Negative  Comment: 1+  Urobilinogen, UA 0.2 0.2 0.2 0.2 0.2 0.2 0.2  Nitrite, UA Negative neg neg negative negative Negative Negative  Leukocytes, UA Negative Negative Moderate (2+) Abnormal  Negative Negative Negative Negative  Appearance    scant dark    Odor      Positive VC              View All Conversations on this Encounter               Component Ref Range & Units  (hover) 6 d ago (06/10/23) 3 mo ago (03/03/23) 3 mo ago (03/03/23) 9 mo ago (09/14/22) 1 yr ago (06/08/22) 1 yr ago (09/24/21) 1 yr ago (06/17/21)  Glucose 82    91  86  Uric Acid 6.3    6.3 CM  5.5 R, CM  Comment:            Therapeutic target for gout patients: <6.0  BUN 15    14  13   Creatinine, Ser 0.62    0.62  0.77  eGFR 107    108  94  BUN/Creatinine Ratio 24 High     23  17  Sodium 142    140  141  Potassium 4.5    4.6  4.7  Chloride 103    105  102  Calcium 9.4    9.4  9.9  Phosphorus 3.4    3.7  4.0  Total Protein 6.9    7.0  7.3  Albumin 4.6    4.5  4.8 R  Globulin, Total 2.3    2.5  2.5  Bilirubin Total 0.5    0.7  0.8  Alkaline Phosphatase 99    84  104  LDH 179    151  168  AST 21    17  23   ALT 10    8  11   GGT 37    31  55  Iron 80 51   99  86  Cholesterol, Total 226 High    202 High  227 High  218 High  237 High   Triglycerides 101   161 High  137 132 128  HDL 65   59 67 59 64  VLDL Cholesterol Cal 18   28 24 23 23   LDL Chol Calc (NIH) 143 High    115 High  136 High  136 High  150 High   Chol/HDL Ratio 3.5   3.4 CM 3.4 CM 3.7 CM 3.7 CM  Comment:                                   T. Chol/HDL Ratio                                             Men  Women                               1/2 Avg.Risk  3.4    3.3                                   Avg.Risk  5.0    4.4                                2X Avg.Risk  9.6    7.1                                3X Avg.Risk 23.4   11.0  Estimated CHD Risk 0.6    0.5 CM  0.7 CM  Comment: The CHD Risk is based on the T. Chol/HDL ratio. Other factors affect CHD Risk such as hypertension, smoking, diabetes, severe obesity, and family history of premature CHD.  TSH 1.400    1.170  1.560  T4, Total 7.5    7.9  7.1  T3 Uptake Ratio 24    26  25   Free Thyroxine Index 1.8    2.1  1.8  WBC 6.2  6.6  6.3  7.8  RBC 5.05  4.91  5.06  5.28  Hemoglobin 14.6  14.0  14.5  15.1  Hematocrit 44.6  43.7  44.6  45.6  MCV 88  89  88  86  MCH  28.9  28.5  28.7  28.6  MCHC 32.7  32.0  32.5  33.1  RDW 12.6  11.8  12.4  12.3  Platelets 247  278  248  265  Neutrophils 60  58  59  64  Lymphs 29  30  30  27   Monocytes 7  8  8  6   Eos 3  3  2  2  Basos 1  1  1  1   Neutrophils Absolute 3.7  3.9  3.8  5.0  Lymphocytes Absolute 1.8  2.0  1.9  2.1  Monocytes Absolute 0.5  0.5  0.5  0.5  EOS (ABSOLUTE) 0.2  0.2  0.1  0.2  Basophils Absolute 0.1  0.0  0.0  0.1  Immature Granulocytes 0  0  0  0  Immature Grans (Abs) 0.0  0.0  0.0  0.0             ____________________________________________  EKG  Normal sinus rhythm at 80 bpm ____________________________________________    ____________________________________________   INITIAL IMPRESSION / ASSESSMENT AND PLAN  As part of my medical decision making, I reviewed the following data within the electronic MEDICAL RECORD NUMBER      No acute findings on physical exam.  Labs reveal elevated cholesterol 229 proteinuria.        ____________________________________________   FINAL CLINICAL IMPRESSION Well exam   ED Discharge Orders     None        Note:  This document was prepared using Dragon voice recognition software and may include unintentional dictation errors.

## 2023-08-04 ENCOUNTER — Ambulatory Visit: Payer: Self-pay

## 2023-08-04 DIAGNOSIS — R0981 Nasal congestion: Secondary | ICD-10-CM

## 2023-08-04 DIAGNOSIS — R059 Cough, unspecified: Secondary | ICD-10-CM

## 2023-08-04 DIAGNOSIS — M545 Low back pain, unspecified: Secondary | ICD-10-CM

## 2023-08-04 LAB — POCT URINALYSIS DIPSTICK
Bilirubin, UA: NEGATIVE
Blood, UA: NEGATIVE
Glucose, UA: NEGATIVE
Ketones, UA: NEGATIVE
Leukocytes, UA: NEGATIVE
Nitrite, UA: NEGATIVE
Protein, UA: NEGATIVE
Spec Grav, UA: 1.01 (ref 1.010–1.025)
Urobilinogen, UA: 0.2 U/dL
pH, UA: 6 (ref 5.0–8.0)

## 2023-08-04 LAB — POC COVID19 BINAXNOW: SARS Coronavirus 2 Ag: NEGATIVE

## 2023-08-04 NOTE — Progress Notes (Signed)
 Complains of lower right back pain. No dysuria, but concern of possible UTI so UA specimen obtained and results negative.  Con't c/o head and chest congestion and using Allegra D and cough meds make her nauseated she said.  Will notify if provider visit needed again and said going home to rest after negative covid testing.  Denies fever and dyspnea.

## 2023-11-04 ENCOUNTER — Other Ambulatory Visit: Payer: Self-pay | Admitting: Physician Assistant

## 2023-11-04 DIAGNOSIS — Z1231 Encounter for screening mammogram for malignant neoplasm of breast: Secondary | ICD-10-CM

## 2023-11-19 ENCOUNTER — Ambulatory Visit
Admission: RE | Admit: 2023-11-19 | Discharge: 2023-11-19 | Disposition: A | Discharge status: Home / Self Care | Source: Ambulatory Visit | Attending: Physician Assistant | Admitting: Physician Assistant

## 2023-11-19 DIAGNOSIS — Z1231 Encounter for screening mammogram for malignant neoplasm of breast: Secondary | ICD-10-CM

## 2023-11-22 ENCOUNTER — Emergency Department
Admission: EM | Admit: 2023-11-22 | Discharge: 2023-11-22 | Disposition: A | Discharge status: Home / Self Care | Attending: Emergency Medicine | Admitting: Emergency Medicine

## 2023-11-22 ENCOUNTER — Other Ambulatory Visit: Payer: Self-pay

## 2023-11-22 ENCOUNTER — Emergency Department

## 2023-11-22 ENCOUNTER — Encounter: Payer: Self-pay | Admitting: Physician Assistant

## 2023-11-22 ENCOUNTER — Encounter: Payer: Self-pay | Admitting: Emergency Medicine

## 2023-11-22 ENCOUNTER — Ambulatory Visit: Payer: Self-pay | Admitting: Physician Assistant

## 2023-11-22 VITALS — BP 139/84 | HR 134 | Temp 98.4°F | Resp 16

## 2023-11-22 DIAGNOSIS — K5732 Diverticulitis of large intestine without perforation or abscess without bleeding: Secondary | ICD-10-CM | POA: Insufficient documentation

## 2023-11-22 DIAGNOSIS — R1030 Lower abdominal pain, unspecified: Secondary | ICD-10-CM | POA: Diagnosis present

## 2023-11-22 DIAGNOSIS — K5792 Diverticulitis of intestine, part unspecified, without perforation or abscess without bleeding: Secondary | ICD-10-CM

## 2023-11-22 LAB — COMPREHENSIVE METABOLIC PANEL WITH GFR
ALT: 12 U/L (ref 0–44)
AST: 21 U/L (ref 15–41)
Albumin: 3.8 g/dL (ref 3.5–5.0)
Alkaline Phosphatase: 79 U/L (ref 38–126)
Anion gap: 11 (ref 5–15)
BUN: 10 mg/dL (ref 6–20)
CO2: 24 mmol/L (ref 22–32)
Calcium: 9.2 mg/dL (ref 8.9–10.3)
Chloride: 103 mmol/L (ref 98–111)
Creatinine, Ser: 0.64 mg/dL (ref 0.44–1.00)
GFR, Estimated: >60 mL/min (ref >60)
Glucose, Bld: 101 mg/dL — ABNORMAL HIGH (ref 70–99)
Potassium: 3.8 mmol/L (ref 3.5–5.1)
Sodium: 138 mmol/L (ref 135–145)
Total Bilirubin: 1.7 mg/dL — ABNORMAL HIGH (ref 0.0–1.2)
Total Protein: 7.2 g/dL (ref 6.5–8.1)

## 2023-11-22 LAB — CBC
HCT: 44.1 % (ref 36.0–46.0)
Hemoglobin: 14.3 g/dL (ref 12.0–15.0)
MCH: 28.3 pg (ref 26.0–34.0)
MCHC: 32.4 g/dL (ref 30.0–36.0)
MCV: 87.2 fL (ref 80.0–100.0)
Platelets: 260 K/uL (ref 150–400)
RBC: 5.06 MIL/uL (ref 3.87–5.11)
RDW: 12.4 % (ref 11.5–15.5)
WBC: 14.1 K/uL — ABNORMAL HIGH (ref 4.0–10.5)
nRBC: 0 % (ref 0.0–0.2)

## 2023-11-22 LAB — LIPASE, BLOOD: Lipase: 28 U/L (ref 11–51)

## 2023-11-22 MED ORDER — KETOROLAC TROMETHAMINE 30 MG/ML IJ SOLN
30.0000 mg | Freq: Once | INTRAMUSCULAR | Status: AC
  Administered 2023-11-22: 30 mg via INTRAMUSCULAR

## 2023-11-22 MED ORDER — ONDANSETRON HCL 4 MG/2ML IJ SOLN
4.0000 mg | Freq: Once | INTRAMUSCULAR | Status: AC
  Administered 2023-11-22: 4 mg via INTRAVENOUS
  Filled 2023-11-22: qty 2

## 2023-11-22 MED ORDER — ONDANSETRON 4 MG PO TBDP
4.0000 mg | ORAL_TABLET | Freq: Three times a day (TID) | ORAL | 0 refills | Status: DC | PRN
Start: 2023-11-22 — End: 2023-12-16

## 2023-11-22 MED ORDER — IOHEXOL 350 MG/ML SOLN
100.0000 mL | Freq: Once | INTRAVENOUS | Status: AC | PRN
  Administered 2023-11-22: 100 mL via INTRAVENOUS

## 2023-11-22 MED ORDER — AMOXICILLIN-POT CLAVULANATE 875-125 MG PO TABS
1.0000 | ORAL_TABLET | Freq: Once | ORAL | Status: AC
  Administered 2023-11-22: 1 via ORAL
  Filled 2023-11-22: qty 1

## 2023-11-22 MED ORDER — OXYCODONE-ACETAMINOPHEN 5-325 MG PO TABS: 1.0000 | ORAL_TABLET | Freq: Three times a day (TID) | ORAL | 0 refills | Status: AC | PRN

## 2023-11-22 MED ORDER — SODIUM CHLORIDE 0.9 % IV SOLN: Freq: Once | INTRAVENOUS | Status: AC

## 2023-11-22 MED ORDER — AMOXICILLIN-POT CLAVULANATE 875-125 MG PO TABS: 1.0000 | ORAL_TABLET | Freq: Two times a day (BID) | ORAL | 0 refills | Status: AC

## 2023-11-22 MED ORDER — MORPHINE SULFATE (PF) 4 MG/ML IV SOLN
4.0000 mg | Freq: Once | INTRAVENOUS | Status: AC
  Administered 2023-11-22: 4 mg via INTRAVENOUS
  Filled 2023-11-22: qty 1

## 2023-11-22 NOTE — Progress Notes (Signed)
   Subjective:    Patient ID: [name], female    DOB: Oct 24, 1970, 53 y.o.   MRN: [id number]  HPI Patient complaining of 3 days of left lower abdominal/pelvic pain patient stated pain appears to start in the left side of the kidney and radiate to the left lower abdominal area.  Patient states abdominal pain is accomplished on nausea but denies vomiting or diarrhea.  Denies dysuria.  No relief with over-the-counter NSAIDs.  Patient states she took a pill for abdominal muscle spasms leftover from prescription a few years ago.  States he took the edge off the pain for a little while.  Patient has a history of gastric sleeve and diverticulitis. Review of Systems Asthma, GERD, hyperplastic colon polyp, osteoarthritis, and obesity.     Objective:   Physical Exam  Moderate distress.  Patient is tachycardic.  Unable to obtain urinalysis secondary to lack of fluid intake. Negative HSM, normoactive bowel sounds, abdominal distention secondary to body habitus, moderate guarding palpation left lower quadrant.     Assessment & Plan: Left lower abdominal pain  No improvement of antiemetic medication and Toradol .  Patient unable to void.  Advised to go to the emergency room for definitive evaluation and treatment.

## 2023-11-22 NOTE — ED Notes (Signed)
Pt teaching provided on medications that may cause drowsiness. Pt instructed not to drive or operate heavy machinery while taking the prescribed medication. Pt verbalized understanding.   Pt provided discharge instructions and prescription information. Pt was given the opportunity to ask questions and questions were answered.   

## 2023-11-22 NOTE — ED Provider Triage Note (Signed)
 Emergency Medicine Provider Triage Evaluation Note  [name] , a 53 y.o. female  was evaluated in triage.  Pt complains of lower abdominal pain.  Patient states having acute onset of suprapubic abdominal pain, associated to nauseas, abdominal distention denies fever, dysuria.  Last bowel movement was yesterday.  Patient has history of cholecystectomy, hysterectomy.   Review of Systems  Positive: Negative:   Physical Exam  BP 118/71 (BP Location: Left Arm)   Pulse (!) 114   Temp 99.2 F (37.3 C) (Oral)   Resp 17   Ht 5' 8 (1.727 m)   Wt 106 kg   SpO2 100%   BMI 35.53 kg/m during triage patient is tachycardic, afebrile Gen:   Awake, acute mild distress due to abdominal pain Resp:  Normal effort  MSK:   Moves extremities without difficulty  Other:  Abdomen: Distended, tender to palpation suprapubic area.  Medical Decision Making  Medically screening exam initiated at 4:07 PM.  Appropriate orders placed.  Cristina A Loveless was informed that the remainder of the evaluation will be completed by another provider, this initial triage assessment does not replace that evaluation, and the importance of remaining in the ED until their evaluation is complete.  Patient with acute onset of suprapubic abdominal pain, history of cholecystectomy and hysterectomy.  Patient has nauseous,.  Patient denies dysuria. There is CBC CMP UA   Janit Kast, PA-C 11/22/23 1610

## 2023-11-22 NOTE — Progress Notes (Signed)
 S/Sx started Friday with discomfort to left side of back (kidney region) Over the weekend it progressed to low abd pain/pelvic pain Has been nauseated - hasn't eaten & even fluids cause nausea No vomiting No diarrhea - Normal BM Denies issues with urinating S/P hysterectomy Hx of Diverticulitis Has taken Ibu 800 mg 2x along with a medication previously prescribed for muscle spasms - states takes the edge off  Reviewed with Tanda Sharps, PA-C Wants [name] to come to clinic for evaluation & UA  Contacted [facility] & scheduled appointment

## 2023-11-22 NOTE — ED Triage Notes (Signed)
 Patient to ED via POV for lower abd cramping with nausea. Denies vomiting or diarrhea. Started yesterday. Denies urinary symptoms. Last BM yesterday. Given a shot at PCP PTA- pt unsure what it was. Hx diverticulitis

## 2023-11-22 NOTE — ED Provider Notes (Signed)
 [facility] Provider Note    Event Date/Time   First MD Initiated Contact with Patient 11/22/23 1617     (approximate)   History   Abdominal Pain   HPI  [name] is a 53 y.o. female with a history of sleeve gastrectomy, cholecystectomy, hysterectomy, appendectomy who presents with complaints of lower abdominal pain which started over the weekend.  She also reports a history of diverticulitis.  No fevers reported.  No dysuria.     Physical Exam   Triage Vital Signs: ED Triage Vitals  Encounter Vitals Group     BP 11/22/23 1605 118/71     Girls Systolic BP Percentile --      Girls Diastolic BP Percentile --      Boys Systolic BP Percentile --      Boys Diastolic BP Percentile --      Pulse Rate 11/22/23 1605 (!) 114     Resp 11/22/23 1605 17     Temp 11/22/23 1605 99.2 F (37.3 C)     Temp Source 11/22/23 1605 Oral     SpO2 11/22/23 1605 100 %     Weight 11/22/23 1606 106 kg (233 lb 11 oz)     Height 11/22/23 1606 1.727 m (5' 8)     Head Circumference --      Peak Flow --      Pain Score 11/22/23 1606 6     Pain Loc --      Pain Education --      Exclude from Growth Chart --     Most recent vital signs: Vitals:   11/22/23 1605  BP: 118/71  Pulse: (!) 114  Resp: 17  Temp: 99.2 F (37.3 C)  SpO2: 100%     General: Awake, no distress.  CV:  Good peripheral perfusion.  Resp:  Normal effort.  Abd:  No distention.  Tenderness palpation the left lower quadrant, no CVA tenderness Other:     ED Results / Procedures / Treatments   Labs (all labs ordered are listed, but only abnormal results are displayed) Labs Reviewed  COMPREHENSIVE METABOLIC PANEL WITH GFR - Abnormal; Notable for the following components:      Result Value   Glucose, Bld 101 (*)    Total Bilirubin 1.7 (*)    All other components within normal limits  CBC - Abnormal; Notable for the following components:   WBC 14.1 (*)    All other components within  normal limits  LIPASE, BLOOD  URINALYSIS, ROUTINE W REFLEX MICROSCOPIC     EKG     RADIOLOGY CT scan viewed interpret by me, consistent with diverticulitis    PROCEDURES:  Critical Care performed:   Procedures   MEDICATIONS ORDERED IN ED: Medications  amoxicillin -clavulanate (AUGMENTIN ) 875-125 MG per tablet 1 tablet (has no administration in time range)  morphine  (PF) 4 MG/ML injection 4 mg (4 mg Intravenous Given 11/22/23 1731)  ondansetron  (ZOFRAN ) injection 4 mg (4 mg Intravenous Given 11/22/23 1731)  0.9 %  sodium chloride  infusion ( Intravenous New Bag/Given 11/22/23 1728)  iohexol  (OMNIPAQUE ) 350 MG/ML injection 100 mL (100 mLs Intravenous Contrast Given 11/22/23 1749)     IMPRESSION / MDM / ASSESSMENT AND PLAN / ED COURSE  I reviewed the triage vital signs and the nursing notes. Patient's presentation is most consistent with acute presentation with potential threat to life or bodily function.  Patient presents with abdominal pain as detailed above.  She has significant surgical history.  Differential includes diverticulitis, diverticular abscess, partial small bowel obstruction, enteritis/colitis.  Will treat with IV morphine , IV Zofran , obtain CT abdomen pelvis and reevaluate.  Lab work notable only for elevated white blood cell count.  ----------------------------------------- 6:50 PM on 11/22/2023 ----------------------------------------- Patient feeling improved after treatment, CT scan is consistent with diverticulitis, recheck patient's heart rate, it is normal at 82 bpm  Offered admission however she would like to try p.o. antibiotics at home with strict return precautions, will give first dose here      FINAL CLINICAL IMPRESSION(S) / ED DIAGNOSES   Final diagnoses:  Diverticulitis     Rx / DC Orders   ED Discharge Orders          Ordered    amoxicillin -clavulanate (AUGMENTIN ) 875-125 MG tablet  2 times daily        11/22/23 1837     ondansetron  (ZOFRAN -ODT) 4 MG disintegrating tablet  Every 8 hours PRN        11/22/23 1837    oxyCODONE -acetaminophen  (PERCOCET) 5-325 MG tablet  Every 8 hours PRN        11/22/23 1837             Note:  This document was prepared using Dragon voice recognition software and may include unintentional dictation errors.   Arlander Charleston, MD 11/22/23 [phone]

## 2023-11-29 ENCOUNTER — Ambulatory Visit: Payer: Self-pay | Admitting: Physician Assistant

## 2023-11-29 ENCOUNTER — Encounter: Payer: Self-pay | Admitting: Physician Assistant

## 2023-11-29 VITALS — BP 108/73 | HR 91 | Temp 97.6°F | Resp 16 | Ht 68.0 in | Wt 230.0 lb

## 2023-11-29 DIAGNOSIS — K5732 Diverticulitis of large intestine without perforation or abscess without bleeding: Secondary | ICD-10-CM

## 2023-11-29 MED ORDER — DICYCLOMINE HCL 20 MG PO TABS: 20.0000 mg | ORAL_TABLET | Freq: Four times a day (QID) | ORAL | 0 refills | Status: AC

## 2023-11-29 MED ORDER — ONDANSETRON HCL 8 MG PO TABS: 8.0000 mg | ORAL_TABLET | Freq: Three times a day (TID) | ORAL | 0 refills | Status: DC | PRN

## 2023-11-29 NOTE — Progress Notes (Signed)
   Subjective:Diverticulitis    Patient ID: [name], female    DOB: 22-Dec-1970, 53 y.o.   MRN: [id number]  HPI  patient returns for reevaluation status post ER visit for diverticulitis.  Patient states she still having nausea. and stomach cramps.  Patient was given a prescription for Augmentin  and 4 mg of Zofran .  Patient was seen on November 26, 2023 in the emergency room.  Review of Systems GERD and hyperplastic colon polyps.  Patient also has a history of osteoarthritis involving many joints.  Patient is status post sleeve gastrectomy.    Objective:   Physical Exam BP 108/73  Cuff Size Large  Pulse Rate 91  Temp 97.6 F (36.4 C)  Temp Source Temporal  Weight 230 lb (104.3 kg)  Height 5' 8 (1.727 m)  Resp 16  SpO2 97 %   BMI: 34.97 kg/m2  BSA: 2.24 m2  Mild distress and appears fatigued. HEENT is unremarkable. Neck is supple pharmacotherapy or bruits. Lungs are clear to auscultation. Heart regular rate and rhythm. Abdomen distended secondary to body habitus.  Normoactive bowel sounds.  Mild guarding with deep palpation of descending colon.       Assessment & Plan: Diverticulitis  Patient advised to continue antibiotics as directed.  Patient given a prescription for Bentyl  and Zofran .  Patient will follow-up in 2 days.

## 2023-11-29 NOTE — Progress Notes (Signed)
 Pt presents today to f/u after ED visit for diverticulitis. Pt still very nauseous. Gave pt of anti-nausea liquid from clinic. Pt tolerated well. Pt requesting detailed verbal report of CT scan.

## 2023-12-01 ENCOUNTER — Encounter: Payer: Self-pay | Admitting: Physician Assistant

## 2023-12-01 ENCOUNTER — Ambulatory Visit: Payer: Self-pay | Admitting: Physician Assistant

## 2023-12-01 VITALS — BP 114/80 | HR 90 | Temp 97.8°F | Resp 14

## 2023-12-01 DIAGNOSIS — K5732 Diverticulitis of large intestine without perforation or abscess without bleeding: Secondary | ICD-10-CM

## 2023-12-01 DIAGNOSIS — B009 Herpesviral infection, unspecified: Secondary | ICD-10-CM

## 2023-12-01 MED ORDER — PENCICLOVIR 1 % EX CREA: 1.0000 | TOPICAL_CREAM | CUTANEOUS | 0 refills | Status: DC

## 2023-12-01 NOTE — Progress Notes (Signed)
   Subjective: Diverticulitis    Patient ID: [name], female    DOB: 03/20/1971, 53 y.o.   MRN: [id number]  HPI Patient is follow-up for abdominal left quadrant pain secondary to diagnosis of diverticulitis about emergency room on 11/14/2023.  Patient was seen on 11/29/2023 and was found to have continue left lower quadrant abdominal pain associated with nausea.  Patient was given a prescription for Bentyl  and Zofran .  Patient also complaining of painful vesicular lesions lower oral lips.  Describes the pain as burning.  Patient applied Vaseline to affected area.   Review of Systems Asthma, GERD, and osteoarthritis.    Objective:   Physical Exam  BP 96/67 114/80  BP Location Left Arm Left Arm  Patient Position Sitting Sitting  Cuff Size Large Large  Pulse Rate 90 --  Temp 97.8 F (36.6 C) --  Temp Source Temporal --  Resp 14 --  SpO2 98 %   Appears malaised. Patient has vesicle lesions lower lips.  Rest of HEENT is unremarkable. Neck is supple for lymphadenopathy or bruits. Lungs are clear to auscultation. Heart regular rate and rhythm. Abdomen slightly distended secondary to body habitus.  Decreased bowel sounds.  Mild guarding with palpation left lower quadrant.      Assessment & Plan: Diverticulitis.  Herpes simplex  Patient advised to continue supportive care.  Patient given a prescription for Denavir  for herpes simplex 1.  Patient given a work note.  Patient may return to work on 12/06/2023.  Follow-up if condition worsens or no improvement.

## 2023-12-01 NOTE — Progress Notes (Signed)
 Feels some better Solid food still upset her stomach Eating soft foods Stomach hurts a lot when she has a BM Has been taking senecot - has been taking every other day

## 2023-12-16 ENCOUNTER — Other Ambulatory Visit: Payer: Self-pay | Admitting: Physician Assistant

## 2023-12-16 ENCOUNTER — Other Ambulatory Visit
Admission: RE | Admit: 2023-12-16 | Discharge: 2023-12-16 | Disposition: A | Discharge status: Home / Self Care | Payer: Self-pay | Source: Ambulatory Visit | Attending: Medical Genetics | Admitting: Medical Genetics

## 2023-12-26 LAB — GENECONNECT MOLECULAR SCREEN: Genetic Analysis Overall Interpretation: NEGATIVE

## 2023-12-29 ENCOUNTER — Other Ambulatory Visit: Payer: Self-pay | Admitting: Physician Assistant

## 2023-12-29 MED ORDER — ALPRAZOLAM 1 MG PO TABS: 1.0000 mg | ORAL_TABLET | Freq: Two times a day (BID) | ORAL | 0 refills | Status: AC | PRN

## 2024-01-05 ENCOUNTER — Emergency Department
Admission: EM | Admit: 2024-01-05 | Discharge: 2024-01-05 | Disposition: A | Discharge status: Home / Self Care | Attending: Emergency Medicine | Admitting: Emergency Medicine

## 2024-01-05 ENCOUNTER — Other Ambulatory Visit: Payer: Self-pay

## 2024-01-05 ENCOUNTER — Emergency Department

## 2024-01-05 DIAGNOSIS — R0789 Other chest pain: Secondary | ICD-10-CM | POA: Insufficient documentation

## 2024-01-05 DIAGNOSIS — M25511 Pain in right shoulder: Secondary | ICD-10-CM | POA: Diagnosis not present

## 2024-01-05 DIAGNOSIS — R1013 Epigastric pain: Secondary | ICD-10-CM | POA: Insufficient documentation

## 2024-01-05 DIAGNOSIS — R110 Nausea: Secondary | ICD-10-CM | POA: Insufficient documentation

## 2024-01-05 LAB — HEPATIC FUNCTION PANEL
ALT: 12 U/L (ref 0–44)
AST: 23 U/L (ref 15–41)
Albumin: 4 g/dL (ref 3.5–5.0)
Alkaline Phosphatase: 79 U/L (ref 38–126)
Bilirubin, Direct: 0.1 mg/dL (ref 0.0–0.2)
Indirect Bilirubin: 0.9 mg/dL (ref 0.3–0.9)
Total Bilirubin: 1 mg/dL (ref 0.0–1.2)
Total Protein: 7.5 g/dL (ref 6.5–8.1)

## 2024-01-05 LAB — BASIC METABOLIC PANEL WITH GFR
Anion gap: 8 (ref 5–15)
BUN: 13 mg/dL (ref 6–20)
CO2: 27 mmol/L (ref 22–32)
Calcium: 9 mg/dL (ref 8.9–10.3)
Chloride: 106 mmol/L (ref 98–111)
Creatinine, Ser: 0.67 mg/dL (ref 0.44–1.00)
GFR, Estimated: >60 mL/min (ref >60)
Glucose, Bld: 117 mg/dL — ABNORMAL HIGH (ref 70–99)
Potassium: 3.9 mmol/L (ref 3.5–5.1)
Sodium: 141 mmol/L (ref 135–145)

## 2024-01-05 LAB — CBC
HCT: 45.2 % (ref 36.0–46.0)
Hemoglobin: 14.4 g/dL (ref 12.0–15.0)
MCH: 28.5 pg (ref 26.0–34.0)
MCHC: 31.9 g/dL (ref 30.0–36.0)
MCV: 89.3 fL (ref 80.0–100.0)
Platelets: 262 K/uL (ref 150–400)
RBC: 5.06 MIL/uL (ref 3.87–5.11)
RDW: 12.4 % (ref 11.5–15.5)
WBC: 6.1 K/uL (ref 4.0–10.5)
nRBC: 0 % (ref 0.0–0.2)

## 2024-01-05 LAB — LIPASE, BLOOD: Lipase: 38 U/L (ref 11–51)

## 2024-01-05 LAB — TROPONIN I (HIGH SENSITIVITY): Troponin I (High Sensitivity): <2 ng/L (ref <18)

## 2024-01-05 MED ORDER — ONDANSETRON 4 MG PO TBDP
4.0000 mg | ORAL_TABLET | Freq: Once | ORAL | Status: AC
  Administered 2024-01-05: 4 mg via ORAL
  Filled 2024-01-05: qty 1

## 2024-01-05 MED ORDER — ALUM & MAG HYDROXIDE-SIMETH 200-200-20 MG/5ML PO SUSP
30.0000 mL | Freq: Once | ORAL | Status: AC
  Administered 2024-01-05: 30 mL via ORAL
  Filled 2024-01-05: qty 30

## 2024-01-05 MED ORDER — LIDOCAINE VISCOUS HCL 2 % MT SOLN
15.0000 mL | Freq: Once | OROMUCOSAL | Status: AC
  Administered 2024-01-05: 15 mL via ORAL
  Filled 2024-01-05: qty 15

## 2024-01-05 MED ORDER — FAMOTIDINE 20 MG PO TABS
20.0000 mg | ORAL_TABLET | Freq: Once | ORAL | Status: AC
  Administered 2024-01-05: 20 mg via ORAL
  Filled 2024-01-05: qty 1

## 2024-01-05 NOTE — Discharge Instructions (Addendum)
 You are seen in the emergency department for abdominal pain and burning with radiation into your chest.  Your heart enzyme was normal.  Your EKG was overall normal.  You do not have any significant lab work abnormalities.  Stay hydrated and drink plenty fluids.  Continue to take your Protonix  40 mg twice a day.  Use your nausea medication as needed.  Call and follow-up closely with your primary care physician.  Discussed with your gastroenterologist whether you may need a repeat endoscopy.  Return to the emergency department if you have any worsening symptoms

## 2024-01-05 NOTE — ED Provider Notes (Addendum)
 [facility] Provider Note    Event Date/Time   First MD Initiated Contact with Patient 01/05/24 1050     (approximate)   History   Chest Pain   HPI  [name] is a 53 y.o. female past medical history significant for multiple prior abdominal surgery who presents to the emergency department chest pain.  Endorses burning epigastric pain and feeling like she had indigestion for the past day.  Started having pain in her right shoulder which concerned her when she was at work so she came into the emergency department for further evaluation.  Does state that she has a history of acid reflux and takes a PPI 40 mg twice daily.  Denies any significant shortness of breath.  No vomiting or diaphoresis.  No cough.  Denies any falls or trauma.  No significant lower abdominal pain.  Denies dysuria, urinary urgency or frequency.  Prior cholecystectomy, hysterectomy, appendectomy, gastric sleeve.  Last EGD 4 years ago that was unremarkable other than a hiatal hernia     Physical Exam   Triage Vital Signs: ED Triage Vitals  Encounter Vitals Group     BP 01/05/24 1031 (!) 144/99     Girls Systolic BP Percentile --      Girls Diastolic BP Percentile --      Boys Systolic BP Percentile --      Boys Diastolic BP Percentile --      Pulse Rate 01/05/24 1031 98     Resp 01/05/24 1031 16     Temp 01/05/24 1031 99.7 F (37.6 C)     Temp Source 01/05/24 1031 Oral     SpO2 01/05/24 1031 100 %     Weight 01/05/24 1029 229 lb 15 oz (104.3 kg)     Height --      Head Circumference --      Peak Flow --      Pain Score 01/05/24 1029 0     Pain Loc --      Pain Education --      Exclude from Growth Chart --     Most recent vital signs: Vitals:   01/05/24 1101 01/05/24 1213  BP:  128/87  Pulse:  (!) 101  Resp:  18  Temp:    SpO2: 100% 99%    Physical Exam Constitutional:      Appearance: She is well-developed.  HENT:     Head: Atraumatic.  Eyes:      Conjunctiva/sclera: Conjunctivae normal.  Cardiovascular:     Rate and Rhythm: Regular rhythm.     Heart sounds: Normal heart sounds.  Pulmonary:     Effort: No respiratory distress.  Abdominal:     General: There is no distension.     Palpations: Abdomen is soft.     Tenderness: There is abdominal tenderness (Mild epigastric abdominal tenderness to palpation).  Musculoskeletal:        General: Normal range of motion.     Cervical back: Normal range of motion.  Skin:    General: Skin is warm.  Neurological:     Mental Status: She is alert. Mental status is at baseline.      IMPRESSION / MDM / ASSESSMENT AND PLAN / ED COURSE  I reviewed the triage vital signs and the nursing notes.  Differential diagnosis including gastritis/PUD, GERD, pancreatitis, ACS, anemia.  No signs or symptoms of GI bleed.  No lower abdominal tenderness to palpation.  Low suspicion for small bowel obstruction  RADIOLOGY I independently reviewed imaging, my interpretation of imaging: Chest x-ray no signs of pneumonia, no widened mediastinum and no free air under the diaphragm   Labs (all labs ordered are listed, but only abnormal results are displayed) Labs interpreted as -    Labs Reviewed  BASIC METABOLIC PANEL WITH GFR - Abnormal; Notable for the following components:      Result Value   Glucose, Bld 117 (*)    All other components within normal limits  CBC  LIPASE, BLOOD  HEPATIC FUNCTION PANEL  TROPONIN I (HIGH SENSITIVITY)  TROPONIN I (HIGH SENSITIVITY)    No significant leukocytosis or anemia.  Clinical picture is most consistent with gastritis/peptic ulcer disease.  Plan for Pepcid , Zofran  and a GI cocktail.  Troponin is undetectable.  Normal lipase and LFTs.  Repeat abdominal exam is overall benign.  Have a very low suspicion for pulmonary embolism, no pleuritic chest pain and no shortness of breath.  Did have some mild tachycardia but no other risk factors for pulmonary embolism.  I  have a low suspicion for pericarditis, no positional changes and no findings on EKG and no rales on exam.  Most likely with gastritis/PUD.  Is already on a PPI.  Discussed Zofran  as needed.  Discussed dietary change and is close follow-up with primary care physician and gastroenterology.  Discussed return for any ongoing or worsening symptoms.     PROCEDURES:  Critical Care performed: No  Procedures  Patient's presentation is most consistent with acute presentation with potential threat to life or bodily function.   MEDICATIONS ORDERED IN ED: Medications  famotidine  (PEPCID ) tablet 20 mg (20 mg Oral Given 01/05/24 1130)  ondansetron  (ZOFRAN -ODT) disintegrating tablet 4 mg (4 mg Oral Given 01/05/24 1130)  alum & mag hydroxide-simeth (MAALOX/MYLANTA) 200-200-20 MG/5ML suspension 30 mL (30 mLs Oral Given 01/05/24 1129)    And  lidocaine  (XYLOCAINE ) 2 % viscous mouth solution 15 mL (15 mLs Oral Given 01/05/24 1129)    FINAL CLINICAL IMPRESSION(S) / ED DIAGNOSES   Final diagnoses:  Atypical chest pain  Nausea     Rx / DC Orders   ED Discharge Orders     None        Note:  This document was prepared using Dragon voice recognition software and may include unintentional dictation errors.   Suzanne Kirsch, MD 01/05/24 8762    Suzanne Kirsch, MD 01/05/24 1239

## 2024-01-05 NOTE — ED Triage Notes (Signed)
 C/O sharp shooting pain to mid chest and right chest x 2 days.  AAOx3.  Skin warm and dry. NAD

## 2024-01-05 NOTE — ED Notes (Signed)
 1213 VS while standing

## 2024-01-06 ENCOUNTER — Other Ambulatory Visit: Payer: Self-pay | Admitting: Physician Assistant

## 2024-01-06 ENCOUNTER — Telehealth: Payer: Self-pay

## 2024-01-06 DIAGNOSIS — R1013 Epigastric pain: Secondary | ICD-10-CM

## 2024-01-06 NOTE — Telephone Encounter (Signed)
 She was in the ED yesterday and reports they told her to have PCP order UGI with ongoing complaints.  Provider updated

## 2024-01-31 ENCOUNTER — Ambulatory Visit
Admission: RE | Admit: 2024-01-31 | Discharge: 2024-01-31 | Disposition: A | Discharge status: Home / Self Care | Source: Ambulatory Visit | Attending: Physician Assistant | Admitting: Physician Assistant

## 2024-01-31 DIAGNOSIS — R1013 Epigastric pain: Secondary | ICD-10-CM | POA: Diagnosis present

## 2024-02-15 ENCOUNTER — Other Ambulatory Visit: Payer: Self-pay

## 2024-02-15 DIAGNOSIS — R110 Nausea: Secondary | ICD-10-CM

## 2024-02-15 MED ORDER — ONDANSETRON HCL 8 MG PO TABS: 8.0000 mg | ORAL_TABLET | Freq: Three times a day (TID) | ORAL | 1 refills | Status: AC | PRN

## 2024-04-24 ENCOUNTER — Encounter: Payer: Self-pay | Admitting: Physician Assistant

## 2024-04-24 ENCOUNTER — Ambulatory Visit: Admitting: Physician Assistant

## 2024-04-24 VITALS — BP 113/75 | HR 128 | Temp 98.6°F | Resp 12

## 2024-04-24 DIAGNOSIS — J9801 Acute bronchospasm: Secondary | ICD-10-CM

## 2024-04-24 MED ORDER — METHYLPREDNISOLONE 4 MG PO TBPK: ORAL_TABLET | ORAL | 0 refills | Status: DC

## 2024-04-24 MED ORDER — HYDROCOD POLI-CHLORPHE POLI ER 10-8 MG/5ML PO SUER: 5.0000 mL | Freq: Two times a day (BID) | ORAL | 0 refills | Status: DC | PRN

## 2024-04-24 MED ORDER — BENZONATATE 200 MG PO CAPS: 200.0000 mg | ORAL_CAPSULE | Freq: Three times a day (TID) | ORAL | 0 refills | Status: DC | PRN

## 2024-04-24 NOTE — Progress Notes (Signed)
" ° °  Subjective: Cough and wheezing    Patient ID: [name], female    DOB: 1971/03/06, 53 y.o.   MRN: [id number]  HPI Patient complaining of 1 week of cough and wheezing.  Denies fever/chills.  Mild dyspnea secondary to coughing spells.  No recent travel or known contact with COVID-19.  Family members are sick with viral respiratory infection.   Review of Systems Asthma, GERD, osteoarthritis, and obesity.    Objective:   Physical Exam BP 113/75  Cuff Size Large  Pulse Rate 128  Temp 98.6 F (37 C)  Temp Source Oral  Resp 12  SpO2 96 %  No acute distress.  Cough throughout exam.   HEENT is unremarkable. Neck is supple lymphadenopathy or bruits. Lungs with wheezing increased cough with deep inspirations. Heart is tachycardic       Assessment & Plan:   Patient given a prescription for Medrol  Dosepak, Tessalon pearls, and Tussionex.  Patient given a work excuse for 2 days.  Advised to follow-up in 2 days if no improvement or worsening complaint. "

## 2024-04-24 NOTE — Progress Notes (Signed)
 Pt presents today with cough since dec 17th. Started with cough fever and body aches, as of now cough and shortness of breath.

## 2024-05-04 ENCOUNTER — Ambulatory Visit: Payer: Self-pay

## 2024-05-04 DIAGNOSIS — Z0000 Encounter for general adult medical examination without abnormal findings: Secondary | ICD-10-CM

## 2024-05-04 DIAGNOSIS — R5383 Other fatigue: Secondary | ICD-10-CM

## 2024-05-04 DIAGNOSIS — E162 Hypoglycemia, unspecified: Secondary | ICD-10-CM

## 2024-05-04 LAB — POCT URINALYSIS DIPSTICK
Bilirubin, UA: NEGATIVE
Blood, UA: NEGATIVE
Glucose, UA: NEGATIVE
Ketones, UA: NEGATIVE
Leukocytes, UA: NEGATIVE
Nitrite, UA: NEGATIVE
Protein, UA: NEGATIVE
Spec Grav, UA: 1.025
Urobilinogen, UA: 0.2 U/dL
pH, UA: 5.5

## 2024-05-04 NOTE — Progress Notes (Signed)
 Nurse Physical: EKG & LABS/UA

## 2024-05-05 ENCOUNTER — Other Ambulatory Visit: Payer: Self-pay

## 2024-05-05 LAB — CMP12+LP+TP+TSH+6AC+CBC/D/PLT
ALT: 11 IU/L (ref 0–32)
AST: 20 IU/L (ref 0–40)
Albumin: 4.5 g/dL (ref 3.8–4.9)
Alkaline Phosphatase: 98 IU/L (ref 49–135)
BUN/Creatinine Ratio: 22 (ref 9–23)
BUN: 13 mg/dL (ref 6–24)
Basophils Absolute: 0 x10E3/uL (ref 0.0–0.2)
Basos: 0 %
Bilirubin Total: 0.6 mg/dL (ref 0.0–1.2)
Calcium: 9.3 mg/dL (ref 8.7–10.2)
Chloride: 101 mmol/L (ref 96–106)
Chol/HDL Ratio: 3.5 ratio (ref 0.0–4.4)
Cholesterol, Total: 240 mg/dL — ABNORMAL HIGH (ref 100–199)
Creatinine, Ser: 0.6 mg/dL (ref 0.57–1.00)
EOS (ABSOLUTE): 0.1 x10E3/uL (ref 0.0–0.4)
Eos: 2 %
Estimated CHD Risk: 0.6 times avg. (ref 0.0–1.0)
Free Thyroxine Index: 2 (ref 1.2–4.9)
GGT: 58 IU/L (ref 0–60)
Globulin, Total: 2.8 g/dL (ref 1.5–4.5)
Glucose: 79 mg/dL (ref 70–99)
HDL: 68 mg/dL
Hematocrit: 46.1 % (ref 34.0–46.6)
Hemoglobin: 14.7 g/dL (ref 11.1–15.9)
Immature Grans (Abs): 0 x10E3/uL (ref 0.0–0.1)
Immature Granulocytes: 0 %
Iron: 109 ug/dL (ref 27–159)
LDH: 173 IU/L (ref 119–226)
LDL Chol Calc (NIH): 149 mg/dL — ABNORMAL HIGH (ref 0–99)
Lymphocytes Absolute: 2.1 x10E3/uL (ref 0.7–3.1)
Lymphs: 29 %
MCH: 28.6 pg (ref 26.6–33.0)
MCHC: 31.9 g/dL (ref 31.5–35.7)
MCV: 90 fL (ref 79–97)
Monocytes Absolute: 0.5 x10E3/uL (ref 0.1–0.9)
Monocytes: 8 %
Neutrophils Absolute: 4.3 x10E3/uL (ref 1.4–7.0)
Neutrophils: 61 %
Phosphorus: 3.9 mg/dL (ref 3.0–4.3)
Platelets: 249 x10E3/uL (ref 150–450)
Potassium: 4.4 mmol/L (ref 3.5–5.2)
RBC: 5.14 x10E6/uL (ref 3.77–5.28)
RDW: 12.1 % (ref 11.7–15.4)
Sodium: 142 mmol/L (ref 134–144)
T3 Uptake Ratio: 28 % (ref 24–39)
T4, Total: 7.3 ug/dL (ref 4.5–12.0)
TSH: 1.27 u[IU]/mL (ref 0.450–4.500)
Total Protein: 7.3 g/dL (ref 6.0–8.5)
Triglycerides: 128 mg/dL (ref 0–149)
Uric Acid: 5.2 mg/dL (ref 3.0–7.2)
VLDL Cholesterol Cal: 23 mg/dL (ref 5–40)
WBC: 7.1 x10E3/uL (ref 3.4–10.8)
eGFR: 107 mL/min/1.73

## 2024-05-05 LAB — VITAMIN D 25 HYDROXY (VIT D DEFICIENCY, FRACTURES): Vit D, 25-Hydroxy: 27 ng/mL — ABNORMAL LOW (ref 30.0–100.0)

## 2024-05-05 LAB — HGB A1C W/O EAG: Hgb A1c MFr Bld: 5.3 % (ref 4.8–5.6)

## 2024-05-08 ENCOUNTER — Encounter: Payer: Self-pay | Admitting: Physician Assistant

## 2024-05-08 ENCOUNTER — Ambulatory Visit: Payer: Self-pay | Admitting: Physician Assistant

## 2024-05-08 VITALS — BP 114/80 | HR 104 | Resp 16 | Ht 68.0 in | Wt 235.0 lb

## 2024-05-08 DIAGNOSIS — Z0000 Encounter for general adult medical examination without abnormal findings: Secondary | ICD-10-CM

## 2024-05-08 DIAGNOSIS — E559 Vitamin D deficiency, unspecified: Secondary | ICD-10-CM

## 2024-05-08 DIAGNOSIS — Z789 Other specified health status: Secondary | ICD-10-CM

## 2024-05-08 MED ORDER — PHENTERMINE HCL 30 MG PO CAPS: 30.0000 mg | ORAL_CAPSULE | ORAL | 0 refills | Status: AC

## 2024-05-08 MED ORDER — VITAMIN D (ERGOCALCIFEROL) 1.25 MG (50000 UNIT) PO CAPS: 50000.0000 [IU] | ORAL_CAPSULE | ORAL | 3 refills | Status: AC

## 2024-05-08 MED ORDER — ROSUVASTATIN CALCIUM 40 MG PO TABS: 40.0000 mg | ORAL_TABLET | Freq: Every day | ORAL | 3 refills | Status: AC

## 2024-05-08 NOTE — Progress Notes (Signed)
 "  [facility]  ____________________________________________   None    (approximate)  I have reviewed the triage vital signs and the nursing notes.   HISTORY  Chief Complaint No chief complaint on file.   HPI [name] is a 54 y.o. female patient presents for annual physical exam.  Patient voiced concern for weight gain and fatigue.  Patient states weight gain of approximately 22 pounds in the past year.         Past Medical History:  Diagnosis Date   Complication of anesthesia    Elevated lipids    GERD (gastroesophageal reflux disease)    Obesity    PONV (postoperative nausea and vomiting)     Patient Active Problem List   Diagnosis Date Noted   Constipation 09/26/2021   Status post sleeve gastrectomy 11/25/2020   Mild intermittent asthma without complication 08/23/2020   Primary osteoarthritis involving multiple joints 08/21/2019   Morbid obesity (HCC) 03/06/2019   Pain in joint, shoulder region 12/20/2018   Gastroesophageal reflux disease 02/15/2017   S/P laparoscopic cholecystectomy 09/11/2015   Hyperplastic colon polyp 08/30/2015    Past Surgical History:  Procedure Laterality Date   ABDOMINAL HYSTERECTOMY     APPENDECTOMY     CHOLECYSTECTOMY     colonoscopy  2018 or 2019   LAPAROSCOPIC OOPHERECTOMY     UPPER GI ENDOSCOPY  2018 or 2019   UPPER GI ENDOSCOPY N/A 11/25/2020   Procedure: UPPER GI ENDOSCOPY;  Surgeon: Gladis Cough, MD;  Location: [facility];  Service: General;  Laterality: N/A;    Prior to Admission medications  Medication Sig Start Date End Date Taking? Authorizing Provider  albuterol (PROVENTIL) (2.5 MG/3ML) 0.083% nebulizer solution VVN QID PRN    [provider]  ALPRAZolam  (XANAX ) 1 MG tablet Take 1 tablet (1 mg total) by mouth 2 (two) times daily as needed for anxiety. 12/29/23   Claudene Tanda POUR, PA-C  dicyclomine  (BENTYL ) 20 MG tablet Take 1 tablet (20 mg total) by mouth every 6 (six)  hours. 11/29/23   Claudene Tanda POUR, PA-C  naproxen  (NAPROSYN ) 500 MG tablet Take 500 mg by mouth 2 (two) times daily. 04/08/24   [provider]  neomycin-polymyxin b-dexamethasone  (MAXITROL) 3.5-10000-0.1 OINT Place 1 Application into both eyes 3 (three) times daily. 03/10/24   [provider]  ondansetron  (ZOFRAN ) 8 MG tablet Take 1 tablet (8 mg total) by mouth every 8 (eight) hours as needed. 02/15/24   Claudene Tanda POUR, PA-C  oxyCODONE -acetaminophen  (PERCOCET) 5-325 MG tablet Take 1 tablet by mouth every 8 (eight) hours as needed for severe pain (pain score 7-10). Patient not taking: Reported on 12/01/2023 11/22/23 11/21/24  Arlander Charleston, MD  pantoprazole  (PROTONIX ) 40 MG tablet Take 1 tablet (40 mg total) by mouth 2 (two) times daily. 12/17/22   Claudene Tanda POUR, PA-C    Allergies Ciprofloxacin   Family History  Problem Relation Age of Onset   Diabetes Mother    Breast cancer Maternal Aunt 75 - 39   Diabetes Maternal Grandmother    Diabetes Maternal Grandfather    Diabetes Paternal Grandmother     Social History Social History[1]  Review of Systems Constitutional: No fever/chills Eyes: No visual changes. ENT: No sore throat. Cardiovascular: Denies chest pain. Respiratory: Denies shortness of breath.  Asthma Gastrointestinal: No abdominal pain.  No nausea, no vomiting.  No diarrhea.  No constipation.  GERD Genitourinary: Negative for dysuria. Musculoskeletal: Negative for back pain. Skin: Negative for rash. Neurological: Negative  for headaches, focal weakness or numbness. Psychiatric: Anxiety Endocrine: Hyperlipidemia Allergic/Immunilogical: Ciprofloxacin   ____________________________________________   PHYSICAL EXAM:  VITAL SIGNS:  05/08/2024   8:37 AM  BP 114/80  Cuff Size Large  Pulse Rate 104  Weight 235 lb (106.6 kg)  Height 5' 8 (1.727 m)  Resp 16  SpO2 97 %   BMI: 35.73 kg/m2  BSA: 2.26 m2   Constitutional: Alert and oriented. Well  appearing and in no acute distress. Eyes: Conjunctivae are normal. PERRL. EOMI. Head: Atraumatic. Nose: No congestion/rhinnorhea. Mouth/Throat: Mucous membranes are moist.  Oropharynx non-erythematous. Neck: No stridor. No cervical spine tenderness to palpation. Hematological/Lymphatic/Immunilogical: No cervical lymphadenopathy. Cardiovascular: Normal rate, regular rhythm. Grossly normal heart sounds.  Good peripheral circulation. Respiratory: Normal respiratory effort.  No retractions. Lungs CTAB. Gastrointestinal: Soft and nontender. No distention. No abdominal bruits. No CVA tenderness. Genitourinary: Deferred Musculoskeletal: No lower extremity tenderness nor edema.  No joint effusions. Neurologic:  Normal speech and language. No gross focal neurologic deficits are appreciated. No gait instability. Skin:  Skin is warm, dry and intact. No rash noted. Psychiatric: Mood and affect are normal. Speech and behavior are normal.  ____________________________________________   LABS _          Component Ref Range & Units (hover) 4 d ago (05/04/24) 9 mo ago (08/04/23) 11 mo ago (06/10/23) 1 yr ago (06/08/22) 2 yr ago (03/30/22) 2 yr ago (06/17/21) 3 yr ago (09/25/20)  Color, UA yellow yellow Light Yellow yellow yellow amber dark yellow  Clarity, UA clear clear Cloudy clear cloudy clear cloudy  Glucose, UA Negative Negative Negative Negative Negative Negative Negative  Bilirubin, UA neg neg Positive CM neg neg negative negative  Ketones, UA neg neg Negative neg neg negative negative  Spec Grav, UA 1.025 1.010 1.025 >=1.030 Abnormal  1.015 1.015 >=1.030 Abnormal   Blood, UA neg neg Positive CM neg neg negative negative  pH, UA 5.5 6.0 5.0 6.0 6.0 6.0 6.0  Protein, UA Negative Negative Positive Abnormal  CM Negative Negative Positive Abnormal  CM Positive Abnormal  CM  Urobilinogen, UA 0.2 0.2 0.2 0.2 0.2 0.2 0.2  Nitrite, UA neg neg Negative neg neg negative negative  Leukocytes, UA Negative  Negative Negative Negative Moderate (2+) Abnormal  Negative Negative  Appearance dark     scant dark  Odor none                Component Ref Range & Units (hover) 4 d ago 1 yr ago  Vit D, 25-Hydroxy 27.0 Low  30.1 CM  Comment: Vitamin D  deficiency has been defined by the Institute of Medicine and an Endocrine Society practice guideline as a level of serum 25-OH vitamin D  less than 20 ng/mL (1,2). The Endocrine Society went on to further define vitamin D  insufficiency as a level between 21 and 29 ng/mL (2). 1. IOM (Institute of Medicine). 2010. Dietary reference    intakes for calcium  and D. [place]: The    [employer]. 2. Holick MF, Binkley Parksley, Bischoff-Ferrari HA, et al.    Evaluation, treatment, and prevention of vitamin D     deficiency: an Endocrine Society clinical practice    guideline. JCEM. 2011 Jul; 96(7):1911-30.              Component Ref Range & Units (hover) 4 d ago 1 yr ago 2 yr ago 3 yr ago  Hgb A1c MFr Bld 5.3 5.6 R 5.1 CM 5.5 CM  Comment:  Prediabetes: 5.7 - 6.4          Diabetes: >6.4          Glycemic control for adults with diabetes: <7.0  HbA1c POC (<> result, manual entry)                         Component Ref Range & Units (hover) 4 d ago (05/04/24) 4 mo ago (01/05/24) 4 mo ago (01/05/24) 4 mo ago (01/05/24) 5 mo ago (11/22/23) 5 mo ago (11/22/23) 11 mo ago (06/10/23)  Glucose 79  117 High  CM  101 High  CM  82  Uric Acid 5.2      6.3 CM  Comment:            Therapeutic target for gout patients: <6.0  BUN 13  13 R  10 R  15  Creatinine, Ser 0.60  0.67 R  0.64 R  0.62  eGFR 107      107  BUN/Creatinine Ratio 22      24 High   Sodium 142  141 R  138 R  142  Potassium 4.4  3.9 R  3.8 R  4.5  Chloride 101  106 R  103 R  103  Calcium  9.3  9.0 R  9.2 R  9.4  Phosphorus 3.9      3.4  Total Protein 7.3 7.5 R   7.2 R  6.9  Albumin 4.5 4.0 R   3.8 R  4.6  Globulin, Total 2.8      2.3  Bilirubin Total 0.6 1.0   1.7 High   0.5   Alkaline Phosphatase 98 79 R   79 R  99 R  LDH 173      179  AST 20 23 R   21 R  21  ALT 11 12 R   12 R  10  GGT 58      37  Iron 109      80  Cholesterol, Total 240 High       226 High   Triglycerides 128      101  HDL 68      65  VLDL Cholesterol Cal 23      18  LDL Chol Calc (NIH) 149 High       143 High   Chol/HDL Ratio 3.5      3.5 CM  Comment:                                   T. Chol/HDL Ratio                                             Men  Women                               1/2 Avg.Risk  3.4    3.3                                   Avg.Risk  5.0    4.4  2X Avg.Risk  9.6    7.1                                3X Avg.Risk 23.4   11.0  Estimated CHD Risk 0.6      0.6 CM  Comment: The CHD Risk is based on the T. Chol/HDL ratio. Other factors affect CHD Risk such as hypertension, smoking, diabetes, severe obesity, and family history of premature CHD.  TSH 1.270      1.400  T4, Total 7.3      7.5  T3 Uptake Ratio 28      24  Free Thyroxine Index 2.0      1.8  WBC 7.1   6.1 R  14.1 High  R 6.2  RBC 5.14   5.06 R  5.06 R 5.05  Hemoglobin 14.7   14.4 R  14.3 R 14.6  Hematocrit 46.1   45.2 R  44.1 R 44.6  MCV 90   89.3 R  87.2 R 88  MCH 28.6   28.5 R  28.3 R 28.9  MCHC 31.9   31.9 R  32.4 R 32.7  RDW 12.1   12.4 R  12.4 R 12.6  Platelets 249   262 R  260 R 247  Neutrophils 61      60  Lymphs 29      29  Monocytes 8      7  Eos 2      3  Basos 0      1  Neutrophils Absolute 4.3      3.7  Lymphocytes Absolute 2.1      1.8  Monocytes Absolute 0.5      0.5  EOS (ABSOLUTE) 0.1      0.2  Basophils Absolute 0.0      0.1  Immature Granulocytes 0      0  Immature Grans (Abs) 0.0      0.0                     ___________________________________________  EKG  Sinus rhythm 83 bpm ____________________________________________    ____________________________________________   INITIAL IMPRESSION / ASSESSMENT AND PLAN As part of my  medical decision making, I reviewed the following data within the electronic MEDICAL RECORD NUMBER      No acute on physical exam and EKG.  Labs reveal increasing levels of cholesterol and low vitamin D .  Patient is a minimal to a trial of statins, vitamin D , and phentermine     ____________________________________________   FINAL CLINICAL IMPRESSION Annual physical exam for findings of weight gain, elevated cholesterol, and decreased vitamin D .   ED Discharge Orders     None        Note:  This document was prepared using Dragon voice recognition software and may include unintentional dictation errors.     [1]  Social History Tobacco Use   Smoking status: Former    Current packs/day: 0.00    Average packs/day: 1.0 packs/day    Types: Cigarettes    Quit date: 04/2018    Years since quitting: 6.0   Smokeless tobacco: Never  Vaping Use   Vaping status: Never Used  Substance Use Topics   Alcohol use: Yes    Alcohol/week: 0.0 - 2.0 standard drinks of alcohol   Drug use: Never   "

## 2024-05-08 NOTE — Progress Notes (Signed)
 Pt presents today to complete physical, pt would like to talk about her weight.

## 2024-05-29 ENCOUNTER — Encounter: Payer: Self-pay | Admitting: Physician Assistant

## 2024-05-29 ENCOUNTER — Ambulatory Visit: Payer: Self-pay | Admitting: Physician Assistant

## 2024-05-29 VITALS — BP 129/81 | HR 100 | Temp 98.1°F | Resp 16

## 2024-05-29 DIAGNOSIS — R0600 Dyspnea, unspecified: Secondary | ICD-10-CM

## 2024-05-29 MED ORDER — BENZONATATE 200 MG PO CAPS: 200.0000 mg | ORAL_CAPSULE | Freq: Three times a day (TID) | ORAL | 0 refills | Status: AC | PRN

## 2024-05-29 MED ORDER — HYDROCOD POLI-CHLORPHE POLI ER 10-8 MG/5ML PO SUER: 5.0000 mL | Freq: Every evening | ORAL | 0 refills | Status: AC | PRN

## 2024-05-29 NOTE — Progress Notes (Signed)
" ° °  Subjective: Cough, dyspnea, and epigastric pain    Patient ID: [name], female    DOB: [birth year], 54 y.o.   MRN: [id number]  HPI Patient complaining of 2 weeks of nonproductive cough, dyspnea, epigastric pain.  States no relief with over-the-counter medications.  Patient states deep inspirations leads to cough and loss of voice.  Patient also complain of epigastric pain which is reproducible with direct pressure.  Patient has a history of GERD takes Protonix .  Denies fever/chills.  Denies recent travel or known contact with COVID-19.  No other family members sick.   Review of Systems Asthma, GERD, and osteoarthritis.    Objective:   Physical Exam BP 129/81  Pulse Rate 100  Temp 98.1 F (36.7 C)  Resp 16  SpO2 97 %  HEENT is unremarkable   Neck is supple lymphadenopathy or bruits. Lungs are clear to auscultation.  Deep inspiration produces nonproductive cough.   Heart regular rate and rhythm Abdomen distended secondary to body habitus.      Assessment & Plan: Cough and dyspnea.  Patient will have chest x-ray today.  Patient advised return back tomorrow for PFTs.  Patient given prescription for Tessalon  [place] and Tussionex to take as directed.  "

## 2024-05-30 ENCOUNTER — Ambulatory Visit
Admission: RE | Admit: 2024-05-30 | Discharge: 2024-05-30 | Disposition: A | Source: Ambulatory Visit | Attending: Physician Assistant | Admitting: Physician Assistant

## 2024-05-30 ENCOUNTER — Ambulatory Visit
Admission: RE | Admit: 2024-05-30 | Discharge: 2024-05-30 | Disposition: A | Source: Home / Self Care | Attending: Physician Assistant | Admitting: Physician Assistant

## 2024-05-30 DIAGNOSIS — R0600 Dyspnea, unspecified: Secondary | ICD-10-CM

## 2024-06-08 ENCOUNTER — Ambulatory Visit: Admitting: Physician Assistant
